# Patient Record
Sex: Male | Born: 1990 | Race: White | Hispanic: No | Marital: Single | Smoking: Never smoker
Health system: Southern US, Community
[De-identification: ages and names within clinical notes are randomized; demographics above are authoritative.]

## PROBLEM LIST (undated history)

## (undated) DIAGNOSIS — F419 Anxiety disorder, unspecified: Secondary | ICD-10-CM

## (undated) DIAGNOSIS — E079 Disorder of thyroid, unspecified: Secondary | ICD-10-CM

## (undated) DIAGNOSIS — G629 Polyneuropathy, unspecified: Secondary | ICD-10-CM

## (undated) HISTORY — PX: TONSILLECTOMY AND ADENOIDECTOMY: SUR1326

## (undated) HISTORY — DX: Anxiety disorder, unspecified: F41.9

## (undated) HISTORY — DX: Polyneuropathy, unspecified: G62.9

---

## 2011-11-03 HISTORY — PX: COSMETIC SURGERY: SHX468

## 2012-03-16 ENCOUNTER — Ambulatory Visit (INDEPENDENT_AMBULATORY_CARE_PROVIDER_SITE_OTHER): Payer: 59 | Admitting: Family Medicine

## 2012-03-16 ENCOUNTER — Telehealth: Payer: Self-pay | Admitting: Family Medicine

## 2012-03-16 ENCOUNTER — Encounter: Payer: Self-pay | Admitting: Family Medicine

## 2012-03-16 VITALS — BP 118/68 | HR 117 | Temp 99.0°F | Ht 64.5 in | Wt 175.0 lb

## 2012-03-16 DIAGNOSIS — L819 Disorder of pigmentation, unspecified: Secondary | ICD-10-CM

## 2012-03-16 NOTE — Patient Instructions (Addendum)
-  PLEASE SIGN UP FOR MYCHART TODAY   -We placed a referral for you to dermatology as discussed. It usually takes about 1-2 weeks to process and schedule this referral. If you have not heard from [place] regarding this appointment in 2 weeks please contact our office.  We recommend the following healthy lifestyle measures: - eat a healthy diet consisting of lots of vegetables, fruits, beans, nuts, seeds, healthy meats such as white chicken and fish and whole grains.  - avoid fried foods, fast food, processed foods, sodas, red meet and other fattening foods.  - get a least 150 minutes of aerobic exercise per week.   Follow up in: for physical once yearly

## 2012-03-16 NOTE — Progress Notes (Signed)
Chief Complaint  Patient presents with  . Establish Care    HPI: Brian Fowler is here to establish care. Working at [employer] and going to school. Used to way 300lbs and then exercised extensively and loss a ton of weight. Had breast reduction after that. Only sexually active with one partner and does not want STI testing. Refused diabetes and lipid screening.  Has the following concerns today:  Has hypopigmented areas of skin on hands, groin and genitalia only -wants to see dermatologist -never used steroid creams or hx of skin conditions  Other Providers: none  Vaccines: had flu, had tdap 4 years ago  ROS: See pertinent positives and negatives per HPI.  History reviewed. No pertinent past medical history.  Family History  Problem Relation Age of Onset  . Arthritis      grandparent  . Breast cancer Mother   . Cancer Mother     breast cancer  . Prostate cancer      grandparent  . Hyperlipidemia      grandparent  . Hypertension      grandparent/parent  . Kidney disease      grandparent   . Hypertension Father   . Diabetes Paternal Grandfather     History   Social History  . Marital Status: Single    Spouse Name: N/A    Number of Children: N/A  . Years of Education: N/A   Social History Main Topics  . Smoking status: Never Smoker   . Smokeless tobacco: None  . Alcohol Use: Yes  . Drug Use: None  . Sexually Active: None   Other Topics Concern  . None   Social History Narrative  . None    No current outpatient prescriptions on file.  EXAM:  Filed Vitals:   03/16/12 1300  BP: 118/68  Pulse: 117  Temp: 99 F (37.2 C)    Body mass index is 29.57 kg/(m^2).  GENERAL: vitals reviewed and listed above, alert, oriented, appears well hydrated and in no acute distress  HEENT: atraumatic, conjunttiva clear, no obvious abnormalities on inspection of external nose and ears  NECK: no obvious masses on inspection  LUNGS: clear to auscultation  bilaterally, no wheezes, rales or rhonchi, good air movement  CV: HRRR, no peripheral edema  SKIN: hypogmented patches on hands  MS: moves all extremities without noticeable abnormality  PSYCH: pleasant and cooperative, no obvious depression or anxiety  ASSESSMENT AND PLAN:  Discussed the following assessment and plan:  1. Skin hypopigmentation  Ambulatory referral to Dermatology  -discussed potential etiologies for hypopigmented skin including vitiligo and other causes - pt would like to see derm for definitive dx, referral placed -congratulated on weight loss and advised continued healthy lifestyle -offered screening for STIs, lipid and diabetes - pt refused -counseled on safe drinking -We reviewed the PMH, PSH, FH, SH, Meds and Allergies. -We addressed current concerns per orders and patient instructions. -We have advised patient to follow up per instructions below.   -Patient advised to return or notify a doctor immediately if symptoms worsen or persist or new concerns arise.  Patient Instructions  -PLEASE SIGN UP FOR MYCHART TODAY   -We placed a referral for you to dermatology as discussed. It usually takes about 1-2 weeks to process and schedule this referral. If you have not heard from [place] regarding this appointment in 2 weeks please contact our office.  We recommend the following healthy lifestyle measures: - eat a healthy diet consisting of lots of vegetables, fruits, beans,  nuts, seeds, healthy meats such as white chicken and fish and whole grains.  - avoid fried foods, fast food, processed foods, sodas, red meet and other fattening foods.  - get a least 150 minutes of aerobic exercise per week.   Follow up in: for physical once yearly      KIM, Dahlia Client R.

## 2012-03-16 NOTE — Telephone Encounter (Signed)
Pts mom came by and said that her son came in for new pt appt today at 4:15pm. Pts mom said that this should have been for a cpx. Pts mom is req that her son get a lab order to have cpx labs and also req to get pts Vit D lvl checked at [facility]. Pls order the req labs and mail the script for labs to pts home address.   Also pts mom believes that its been a long time since pt had a tetanus shot. Pt works for [employer] and there is a high risk for injury.

## 2012-03-17 NOTE — Telephone Encounter (Signed)
Left a message for return call.  

## 2012-03-17 NOTE — Telephone Encounter (Signed)
Pls advise on labs.  

## 2012-03-17 NOTE — Telephone Encounter (Signed)
Lab testing for labs recommended for his age were offered at the visit - patient declined. Also, tdap was offered - pt reported he had tetanus booster 4 years ago.

## 2012-03-24 NOTE — Telephone Encounter (Signed)
Left a message for pt to return call 

## 2012-03-25 NOTE — Telephone Encounter (Signed)
Left a message for return call.  

## 2012-03-31 ENCOUNTER — Telehealth: Payer: Self-pay | Admitting: Family Medicine

## 2012-03-31 NOTE — Telephone Encounter (Signed)
Pts mom called and said that pt hasn't had a tetanus shot since he was a little boy. Pt also needs to get a lab order done at [facility] for whatever labs a person his age needs and also Vit D. Has to get labs done at [facility] for pts insurance to cover. Pts mom will pick pick up lab order when done.

## 2012-04-05 NOTE — Telephone Encounter (Signed)
Please see phone note from 03/2012. If pt needs tdap - can come for nurse appt for this. Vitamin D screening is not recommended for his age. Labs for his age (STI screening) were discussed at his appointment and he declined. Please contact patient and see if he has any specific concerns. He is old enough to make his own medical decisions.

## 2012-04-05 NOTE — Telephone Encounter (Signed)
Attempted to call pt on his cell phone and home number.  No answer and no return call. Pls advise.

## 2012-04-07 NOTE — Telephone Encounter (Signed)
Attempted to call pt home and cell phone- messages left for return call.

## 2012-04-12 NOTE — Telephone Encounter (Signed)
Letter mailed to pts home address.  

## 2012-06-02 ENCOUNTER — Telehealth: Payer: Self-pay | Admitting: Family Medicine

## 2012-06-02 NOTE — Telephone Encounter (Signed)
Probably should be seen and bring labs so can decide if referral needed and if so what referral is for. If wants endocrine referral I can place this, but don't know what it is for?

## 2012-06-02 NOTE — Telephone Encounter (Signed)
Pls advise.  

## 2012-06-02 NOTE — Telephone Encounter (Signed)
Pt followed up w/ dermatologist as you advised. Thyroid test showed abnormal. Is this something you can address or does pt need to go to endocrinologist?

## 2012-06-03 ENCOUNTER — Telehealth: Payer: Self-pay

## 2012-06-03 ENCOUNTER — Encounter: Payer: Self-pay | Admitting: Family Medicine

## 2012-06-03 ENCOUNTER — Ambulatory Visit (INDEPENDENT_AMBULATORY_CARE_PROVIDER_SITE_OTHER): Payer: 59 | Admitting: Family Medicine

## 2012-06-03 VITALS — BP 104/68 | HR 102 | Temp 98.2°F | Wt 170.0 lb

## 2012-06-03 DIAGNOSIS — F411 Generalized anxiety disorder: Secondary | ICD-10-CM

## 2012-06-03 DIAGNOSIS — R946 Abnormal results of thyroid function studies: Secondary | ICD-10-CM

## 2012-06-03 DIAGNOSIS — R7989 Other specified abnormal findings of blood chemistry: Secondary | ICD-10-CM

## 2012-06-03 DIAGNOSIS — F419 Anxiety disorder, unspecified: Secondary | ICD-10-CM

## 2012-06-03 NOTE — Telephone Encounter (Signed)
Brian Fowler,  Mom called today to ask about this. Per chart note, I made Brian Fowler and appointment for later today. Mom bringing him and the labs.

## 2012-06-03 NOTE — Progress Notes (Signed)
Chief Complaint  Patient presents with  . Discuss Thyroid Lab    HPI:  Acute visit for abnormal thyroid labs: -wanted referral to endocrine per phone notes - but did not know nature of problem to place referral -no-one in immediate family with thyroid issues -hx of intentional extreme weight loss with breast reduction surgery following weight loss -labs drawn at derm office and showed low TSH at 0.005 -pt reports he feels fine -denies: fevers, chills, heat/cold intolerance, hair loss, emotional lability, weakness, tremor, palpitations, increased perspiration, and weight loss despite a normal or increased appetite, constipation, myalgia, fatigue, HA, polyuria, poydipsia -does have anxiety and stress related to a recent break up from long term relationship - seeing a psychiatrist -has vitiligo recently dx by Dr. Terri Piedra   ROS: See pertinent positives and negatives per HPI.  No past medical history on file.  Family History  Problem Relation Age of Onset  . Arthritis      grandparent  . Breast cancer Mother   . Cancer Mother     breast cancer  . Prostate cancer      grandparent  . Hyperlipidemia      grandparent  . Hypertension      grandparent/parent  . Kidney disease      grandparent   . Hypertension Father   . Diabetes Paternal Grandfather     History   Social History  . Marital Status: Single    Spouse Name: N/A    Number of Children: N/A  . Years of Education: N/A   Social History Main Topics  . Smoking status: Never Smoker   . Smokeless tobacco: None  . Alcohol Use: Yes  . Drug Use: None  . Sexually Active: None   Other Topics Concern  . None   Social History Narrative  . None    Current outpatient prescriptions:escitalopram (LEXAPRO) 10 MG tablet, Take 10 mg by mouth daily., Disp: , Rfl:   EXAM:  Filed Vitals:   06/03/12 1356  BP: 104/68  Pulse: 102  Temp: 98.2 F (36.8 C)   Pulse normal at 82 and regular on my exam There is no height on  file to calculate BMI.  GENERAL: vitals reviewed and listed above, alert, oriented, appears well hydrated and in no acute distress  HEENT: atraumatic, conjunttiva clear, no obvious abnormalities on inspection of external nose and ears, no bulging eye  NECK: no obvious masses on inspection, thyroid normal on palpation  LUNGS: clear to auscultation bilaterally, no wheezes, rales or rhonchi, good air movement  CV: HRRR, no peripheral edema  MS/NEURO: moves all extremities without noticeable abnormality  PSYCH: pleasant and cooperative, no obvious depression or anxiety  ASSESSMENT AND PLAN:  Discussed the following assessment and plan:  1. Anxiety - followed by Dr. Antony Madura    2. Abnormal thyroid blood test  TSH, T4, Free, T3, Free   -will recheck TSH, Free T3 and T4 - mother wants referral to endo after results  -pt asymptomatic currently, recently started lexapro - no other meds and has situational cause for anxiety - calm on exam  -follow up pending results -Patient advised to return or notify a doctor immediately if symptoms worsen or persist or new concerns arise.  There are no Patient Instructions on file for this visit.   Kriste Basque R.

## 2012-06-03 NOTE — Telephone Encounter (Signed)
Pt's mother called and states that raspberry ketones ( he seen this on Dr. Neil Crouch).  Pt confessed to his mother that he had been taking this at least a year ago.  Pt's mother states she looked this up online and this and it states this could cause thyroid problems.  Pt's mother just wants Dr. Selena Batten aware.

## 2012-06-08 NOTE — Telephone Encounter (Signed)
Pt with repeat thyroid studies abnormal. Referral placed to endocrine for evaluation. Please let mother know. Thanks!

## 2012-06-08 NOTE — Telephone Encounter (Signed)
Spoke to pt's mother told her Thyroid labs came back today and confirm elevated thyroid and referral was sent to Endocrinology per dr. Selena Batten. Pt's mother verbalized understanding and stated they were already notified have an appointment on Thursday with Endo. Pt's mother concerned about cancer and wants to speaks to Dr. Selena Batten. Told her I will have her get back to her. Spoke to Dr. Selena Batten she said she can not answer that for sure but usually just hyperthyroidism which you see a lot of. Called pt's mother back and told her Spoke to Dr. Selena Batten she said she can not answer that for sure but usually just hyperthyroidism which you see a lot of and needs to see specialist. Pt's mother verbalized understanding.

## 2012-06-08 NOTE — Telephone Encounter (Signed)
Pt would like results of labs done.

## 2012-06-10 ENCOUNTER — Ambulatory Visit (INDEPENDENT_AMBULATORY_CARE_PROVIDER_SITE_OTHER): Payer: 59 | Admitting: Internal Medicine

## 2012-06-10 ENCOUNTER — Encounter: Payer: Self-pay | Admitting: Internal Medicine

## 2012-06-10 VITALS — BP 108/60 | HR 87 | Temp 98.4°F | Resp 16 | Ht 65.0 in | Wt 168.0 lb

## 2012-06-10 DIAGNOSIS — E0590 Thyrotoxicosis, unspecified without thyrotoxic crisis or storm: Secondary | ICD-10-CM

## 2012-06-10 DIAGNOSIS — E0500 Thyrotoxicosis with diffuse goiter without thyrotoxic crisis or storm: Secondary | ICD-10-CM

## 2012-06-10 NOTE — Progress Notes (Addendum)
Subjective:     Patient ID: Brian Fowler, male   DOB: 1991/03/26, 22 y.o.   MRN: [id number]  HPI Brian Fowler is a 22 -year-old man, referred by his primary care physician, Brian Fowler, for evaluation and treatment for hyperthyroidism.  I reviewed last PCP note from 06/03/12. Pt was seen by Brian Fowler to establish care after a TSH returned 0.005 in dermatology office. In 06/03/2012, a repeat TSH returned again low, at 0.005, a fT4 was high, at 3.73 (0.82-1.77), while a free T3 was 15.7 (2-4.4). Pt and his mother wanted a referral to Endocrine for this finding.  No immediate FH of thyroid issues. Cousin has hypothyroidism. Pt has been diagnosed with vitiligo last year - sees Brian Fowler. He checked pt's thyroid tests based on the presence of this autoimmune disorder.  Pt has a h/o intentional extreme weight loss of 100 lbs in a little over a year, due to dieting and exercise and had to have breast reduction surgery following weight loss. He does mention, though, that he appears to be able to eat what he wants without gaining the weight back. His mom tells me that he has anxiety and stress related to a recent break up from long term relationship (this ended about 8 months ago) - seeing a psychiatrist: Dr Brian Fowler - they would like that I sent my note to him, too. Has ADHD dx in 2nd grade, recently started on Medication: Lexapro - he does not feel that this is helping. He has not been on Adderall or other stimulants.   He denies: fevers, chills, no heat/cold intolerance, tremor, palpitations, and recent weight loss despite a normal or increased appetite, diarrhea. He denies having problems with concentration in school more than in the past.  Review of Systems Constitutional: no weight gain/+ loss, + fatigue, no subjective hyperthermia/hypothermia, + insomnia Eyes: no blurry vision, no xerophthalmia ENT: no sore throat, no nodules palpated in throat, no dysphagia/odynophagia, no hoarseness Cardiovascular: no  CP/SOB/palpitations/leg swelling Respiratory: no cough/SOB Gastrointestinal: N when eating fatty foods/V/D/C Musculoskeletal: no muscle/joint aches Skin: no rashes Neurological: no tremors/numbness/tingling/dizziness Psychiatric: no depression/+ anxiety  Past Medical History  Diagnosis Date  . Anxiety    Past Surgical History  Procedure Date  . Tonsillectomy and adenoidectomy   . Cosmetic surgery 11/2011    for gynecomastia   History   Social History  . Marital Status: Single    Spouse Name: N/A    Number of Children: 0  . Years of Education: N/A   Occupational History  . student   Social History Main Topics  . Smoking status: Never Smoker   . Smokeless tobacco: Not on file  . Alcohol Use: 0.5 oz/week    1 drink(s) per week  . Drug Use: No  . Sexually Active: Yes    Birth Control/ Protection: Condom   Social History Narrative   ATTENDS GGTCHOBBIES: CARD STRATAGIES GAME: LIFT WEIGHTS AND RUNNING   Current Outpatient Prescriptions on File Prior to Visit  Medication Sig Dispense Refill  . escitalopram (LEXAPRO) 10 MG tablet Take 10 mg by mouth daily.       Allergies  Allergen Reactions  . Penicillins     Swelling, hives, SOB   Family History  Problem Relation Age of Onset  . Arthritis      grandparent  . Breast cancer Mother   . Cancer Mother     breast cancer  . Prostate cancer      grandparent  . Hyperlipidemia  grandparent  . Hypertension      grandparent/parent  . Kidney disease      grandparent   . Hypertension Father   . Diabetes Paternal Grandfather    Objective:   Physical Exam BP 108/60  Pulse 87  Temp 98.4 F (36.9 C) (Oral)  Resp 16  Ht 5\' 5"  (1.651 m)  Wt 168 lb (76.204 kg)  BMI 27.96 kg/m2  SpO2 98%   Wt Readings from Last 3 Encounters:  06/10/12 168 lb (76.204 kg)  06/03/12 170 lb (77.111 kg)  03/16/12 175 lb (79.379 kg)   Constitutional: over weight, in NAD Eyes: PERRLA, EOMI, no exophthalmos ENT: moist mucous  membranes, no thyromegaly but thyroid bruit auscultated over R thyroid, no cervical lymphadenopathy Cardiovascular: RRR, No MRG Respiratory: CTA B Gastrointestinal: abdomen soft, NT, ND, BS+ Musculoskeletal: no deformities, strength intact in all 4 Skin: moist, warm, no rashes, stretch marks on arms, faint vitiligo patches on elbows Neurological: no tremor with outstretched hands, DTR normal in all 4  Assessment:     1. Hyperthyroidism    Plan:      Pt has a history of 100-pound intentional weight loss over a year, however this might have been contributed to by his hyperthyroidism. It is unknown for how long he has had the hyperthyroidism, and also he cannot remember if he had an upper respiratory infection episode or tenderness in the thyroid region in the previous months. - We discussed about the fact that his tests are undoubtedly abnormal suggesting overt hyperthyroidism. The fact that he does not have symptoms might suggest that his tests were abnormal for a longer time. His anxiety may be, in part, explained by the hyperthyroidism. - I explained the 3 main causes for hyperthyroidism: 1. Thyroiditis 2. Graves' disease (he already has an autoimmune disorder, so he is predisposed for the second) 3. uni-or multinodular goiter - The only way to differentiate between these 3 is to do a thyroid uptake and scan - I ordered the test, and the patient will be called with the appointment date and time - As I get the results back, I will call them with a further plan, which might involve observation (4 thyroiditis), radioactive iodine treatment or methimazole treatment.  - The fact that he has a thyroid bruit biases the diagnosis towards Graves ds. I explained that in case we decide to get the radioactive iodine ablation, we might be successful at the first try or we might need to repeat the treatment. I explained the natural evolution of Graves ds., and the fact that we can use methimazole for a  period of time while waiting for the inflammation to resolve by itself. - I will see the patient back in 2 months, regardless of what treatment we will initiate  New Dx of Graves Ds:  Clinical Data: Hyperthyroidism. Serum TSH: 0.005.   THYROID SCAN AND UPTAKE - 24 HOURS   Technique: Following the per oral administration of I-131 Sodium  Iodide, the patient returned at 24 hours and uptake measurements  were acquired with the uptake probe centered on the neck. Thyroid  imaging was performed following the intravenous administration of  the Tc-39m Pertechnetate.  Radiopharmaceuticals: 12.9uCi I-131 Sodium Iodide orally and78mCi  Tc-52m Pertechnetate intravenously.  Comparison: None.  Findings: The 24 hour uptake by the thyroid gland is 53.8%.  Normal 24 hour uptake range is 10-35%.  The thyroid scan demonstrates slightly heterogeneous activity  within the right lobe without focal hot or cold nodule. The  activity in the left lobe is fairly homogeneous.   IMPRESSION:  1. Elevated (54%) 24 hour radioiodine uptake by the thyroid gland.  2. 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 No discrete nodularity is apparent.  Original Report Authenticated By: Carey Bullocks, M.D.  Discussed with pt and his parents >> offered to either try MMI or the more definitive RAI ablation. They thought about it over the weekend >> decided for RAI ablation. I will arrange for this. Will see pt back in 3-4 weeks after his ablation.

## 2012-06-10 NOTE — Patient Instructions (Signed)
Please return in 2 months.  Hyperthyroidism The thyroid is a large gland located in the lower front part of your neck. The thyroid helps control metabolism. Metabolism is how your body uses food. It controls metabolism with the hormone thyroxine. When the thyroid is overactive, it produces too much hormone. When this happens, these following problems may occur:   Nervousness  Heat intolerance  Weight loss (in spite of increase food intake)  Diarrhea  Change in hair or skin texture  Palpitations (heart skipping or having extra beats)  Tachycardia (rapid heart rate)  Loss of menstruation (amenorrhea)  Shaking of the hands CAUSES  Grave's Disease (the immune system attacks the thyroid gland). This is the most common cause.  Inflammation of the thyroid gland.  Tumor (usually benign) in the thyroid gland or elsewhere.  Excessive use of thyroid medications (both prescription and 'natural').  Excessive ingestion of Iodine. DIAGNOSIS  To prove hyperthyroidism, your caregiver may do blood tests and ultrasound tests. Sometimes the signs are hidden. It may be necessary for your caregiver to watch this illness with blood tests, either before or after diagnosis and treatment. TREATMENT Short-term treatment There are several treatments to control symptoms. Drugs called beta blockers may give some relief. Drugs that decrease hormone production will provide temporary relief in many people. These measures will usually not give permanent relief. Definitive therapy There are treatments available which can be discussed between you and your caregiver which will permanently treat the problem. These treatments range from surgery (removal of the thyroid), to the use of radioactive iodine (destroys the thyroid by radiation), to the use of antithyroid drugs (interfere with hormone synthesis). The first two treatments are permanent and usually successful. They most often require hormone replacement  therapy for life. This is because it is impossible to remove or destroy the exact amount of thyroid required to make a person euthyroid (normal). HOME CARE INSTRUCTIONS  See your caregiver if the problems you are being treated for get worse. Examples of this would be the problems listed above. SEEK MEDICAL CARE IF: Your general condition worsens. MAKE SURE YOU:   Understand these instructions.  Will watch your condition.  Will get help right away if you are not doing well or get worse. Document Released: 04/21/2005 Document Revised: 07/14/2011 Document Reviewed: 09/02/2006 [facility] Patient Information 2013 [place], [place].

## 2012-06-15 ENCOUNTER — Telehealth: Payer: Self-pay | Admitting: *Deleted

## 2012-06-15 NOTE — Telephone Encounter (Signed)
Called and talked with insurance dr. The problem was that the dx was entered as hypothyroidism, but he has hyperthyroidism. Test was approved for 45 days.   Notification nr.: [phone]  Will route to [facility].

## 2012-06-15 NOTE — Telephone Encounter (Signed)
MARY , PPC AT [facility]. CALLED STATED THAT PATIENT INSURANCE [employer] IS REQUIRING A PEER TO PEER CALL FROM DR. GHERGHE  TO CONSIDER AUTHORIZING TEST THYROID UPTAKE AND SCAN FOR PATIENT. Fuller Song [employer] [phone] OPTION 3:  CASE # IS [phone] ::ID # IS [id number]. PLEASE CALL MARY AT PATIENT CARE COODINATOR AT [phone]. WITH AUTHORIZATION # IF APPROVED SO SHE CAN ENTER INTO CHART.

## 2012-06-24 ENCOUNTER — Encounter (HOSPITAL_COMMUNITY)
Admission: RE | Admit: 2012-06-24 | Discharge: 2012-06-24 | Disposition: A | Discharge status: Home / Self Care | Payer: 59 | Source: Ambulatory Visit | Attending: Internal Medicine | Admitting: Internal Medicine

## 2012-06-24 DIAGNOSIS — E0590 Thyrotoxicosis, unspecified without thyrotoxic crisis or storm: Secondary | ICD-10-CM | POA: Insufficient documentation

## 2012-06-25 ENCOUNTER — Encounter (HOSPITAL_COMMUNITY)
Admission: RE | Admit: 2012-06-25 | Discharge: 2012-06-25 | Disposition: A | Discharge status: Home / Self Care | Payer: 59 | Source: Ambulatory Visit | Attending: Internal Medicine | Admitting: Internal Medicine

## 2012-06-25 DIAGNOSIS — E0590 Thyrotoxicosis, unspecified without thyrotoxic crisis or storm: Secondary | ICD-10-CM | POA: Insufficient documentation

## 2012-06-25 MED ORDER — SODIUM IODIDE I 131 CAPSULE
12.9000 | Freq: Once | INTRAVENOUS | Status: AC | PRN
  Administered 2012-06-25: 12.9 via ORAL

## 2012-06-25 MED ORDER — SODIUM PERTECHNETATE TC 99M INJECTION
9.9000 | Freq: Once | INTRAVENOUS | Status: AC | PRN
  Administered 2012-06-25: 10 via INTRAVENOUS

## 2012-06-28 DIAGNOSIS — E0500 Thyrotoxicosis with diffuse goiter without thyrotoxic crisis or storm: Secondary | ICD-10-CM | POA: Insufficient documentation

## 2012-06-28 NOTE — Addendum Note (Signed)
Addended by: Carlus Pavlov on: 06/28/2012 08:25 AM   Modules accepted: Orders

## 2012-06-30 ENCOUNTER — Telehealth: Payer: Self-pay | Admitting: *Deleted

## 2012-06-30 NOTE — Telephone Encounter (Signed)
Spoke with father concerning of lab test prior to radio active iodine test. Father unaware that test had been rescheduled to March 6th at 1:00pm. I called  [facility] department to verify if any lab test are needed prior to test for [facility] on March 6th. Nurse and Doctor confirmed they needed no lab work done on patient. Called Mr. Vivona back and left message on his cell # of this information.

## 2012-06-30 NOTE — Telephone Encounter (Signed)
Patient mother called requesting that lab ordered be done prior to Radio Active Iodine testing to be done on March 6 th. Please notify mother or father of this.  Please call home # [phone] or dad at # [phone]. Let me know and I can call . Fannie Knee

## 2012-06-30 NOTE — Telephone Encounter (Signed)
All right, no labs then.

## 2012-06-30 NOTE — Telephone Encounter (Signed)
Fannie Knee, do you know if the [place] department required these labs? We can definitely repeat a TSH and free T4 if needed, but I was wondering what they had in mind when they called. I would appreciate if you can put in the order for these 2 labs as future orders for [facility] (dad works there and they are free for [facility]) and print them - pt or parents will need to come and pick them up.

## 2012-07-08 ENCOUNTER — Encounter (HOSPITAL_COMMUNITY)
Admission: RE | Admit: 2012-07-08 | Discharge: 2012-07-08 | Disposition: A | Discharge status: Home / Self Care | Payer: 59 | Source: Ambulatory Visit | Attending: Internal Medicine | Admitting: Internal Medicine

## 2012-07-08 ENCOUNTER — Encounter (HOSPITAL_COMMUNITY): Payer: Self-pay

## 2012-07-08 DIAGNOSIS — E0500 Thyrotoxicosis with diffuse goiter without thyrotoxic crisis or storm: Secondary | ICD-10-CM | POA: Insufficient documentation

## 2012-07-08 MED ORDER — SODIUM IODIDE I 131 CAPSULE: 16.1000 | Freq: Once | INTRAVENOUS | Status: DC | PRN

## 2012-07-08 MED ORDER — SODIUM IODIDE I 131 CAPSULE
16.1000 | Freq: Once | INTRAVENOUS | Status: AC | PRN
  Administered 2012-07-08: 16.1 via ORAL

## 2012-07-14 ENCOUNTER — Telehealth: Payer: Self-pay | Admitting: Internal Medicine

## 2012-07-14 NOTE — Telephone Encounter (Signed)
The patient's mother called to report that the patient is still experiencing weight loss and is having trouble sleeping since taking the "radiation pill".  Please call the patient's mother at [phone].

## 2012-07-15 NOTE — Telephone Encounter (Signed)
Patient's mother states that the patient has lost 9 lbs since taking the "radiation pill" and is having severe sleeping issues.  The patient's mother is very worried and anxious about her son's condition.  The patient's mother believes that he should have lab work prior to follow up appointment on 08/06/12.  She would need the rx mailed to her so that she can have her son's blood work done at [facility].  The patient's mother states that the patient's psychiatrist who is treating him ADD, has given him a new medication and the patient's mother feels it is making his sleeping issue worse. The patient would like to switch doctors to Dr. Everardo All and has scheduled his follow up from the i-131 on 08/06/12 to Dr. Everardo All.  The patient's mother requests return call asap to discuss her son's medical issues.

## 2012-07-15 NOTE — Telephone Encounter (Signed)
Brian Fowler, they require a call from me or from Dr. Everardo All (since they are switching their care to him)?

## 2012-07-16 ENCOUNTER — Telehealth: Payer: Self-pay | Admitting: Internal Medicine

## 2012-07-16 ENCOUNTER — Ambulatory Visit (HOSPITAL_COMMUNITY): Payer: 59

## 2012-07-16 DIAGNOSIS — E0500 Thyrotoxicosis with diffuse goiter without thyrotoxic crisis or storm: Secondary | ICD-10-CM

## 2012-07-16 DIAGNOSIS — G4700 Insomnia, unspecified: Secondary | ICD-10-CM

## 2012-07-16 MED ORDER — ALPRAZOLAM 0.5 MG PO TABS: ORAL_TABLET | ORAL | Status: DC

## 2012-07-16 NOTE — Telephone Encounter (Signed)
Called this am >> left VM that I will try again later.

## 2012-07-16 NOTE — Telephone Encounter (Signed)
Called by patient's mom as she did not know that I already talked to [place] earlier. She was complaining about Brian Fowler's not sleeping through the entire night she does not think that he can function like this. She also acknowledged that he has been having this problem for months, but she believes it is from his Graves' disease. I think this is possible. She also complains that he has lost 9 pounds in the last 2 weeks.   I offered to send Xanax to his pharmacy but will only supply one week after that they will need to contact either his PCP or his psychiatrist for further management. I explained that this will likely improve as the thyroid levels decrease.  They would want me to order labs for [name] next appointment, to be done at [name]. They want me to send the labs to their home (lab orders sent). I advised her to have [name] return to see [place] (per their choice, Dr. Everardo All) in 3 more weeks.

## 2012-07-16 NOTE — Telephone Encounter (Signed)
Dr. Elvera Lennox, The patient's mother requests return call from you since you are familiar with his condition and Dr. Everardo All has not assumed care of patient yet. Thanks, [employer]

## 2012-07-16 NOTE — Telephone Encounter (Signed)
Called and discussed with pt. He tells me that he has had issues with lack of sleep for a long time, and these are not exacerbated now. He functions well on "10 min sleep". He has tries many remedies for sleep, but nothing works. No other complaints. His appetite is back to normal, he says (it was increased before the RAI tx). I offered to call in something to let him sleep, but he told me that has already taken a lot of medicines for this and they don't work. I advised him to get in touch with Dr. Selena Batten and it would not be unreasonable to see a sleep specialist.

## 2012-08-05 ENCOUNTER — Ambulatory Visit: Payer: 59 | Admitting: Internal Medicine

## 2012-08-06 ENCOUNTER — Encounter: Payer: Self-pay | Admitting: Endocrinology

## 2012-08-06 ENCOUNTER — Ambulatory Visit (INDEPENDENT_AMBULATORY_CARE_PROVIDER_SITE_OTHER): Payer: 59 | Admitting: Endocrinology

## 2012-08-06 VITALS — BP 122/80 | HR 78 | Wt 170.0 lb

## 2012-08-06 DIAGNOSIS — E0500 Thyrotoxicosis with diffuse goiter without thyrotoxic crisis or storm: Secondary | ICD-10-CM

## 2012-08-06 MED ORDER — METHIMAZOLE 10 MG PO TABS: ORAL_TABLET | ORAL | Status: DC

## 2012-08-06 NOTE — Patient Instructions (Addendum)
i have sent a prescription to your pharmacy, to get the thyroid better faster. if ever you have fever while taking methimazole, stop it and call us, because of the risk of a rare side-effect.   The radioactive iodine will work over the next 1-2 months as well. Please come back for a follow-up appointment for 1 month.

## 2012-08-06 NOTE — Progress Notes (Signed)
  Subjective:    Patient ID: Brian Fowler, male    DOB: June 16, 1990, 22 y.o.   MRN: [id number]  HPI In march of 2014, pt had i-131 rx, for hyperthyroidism due to grave's dz.  He still has palpitations,nausea, and cough.  He says he has intermittent fever (103). Past Medical History  Diagnosis Date  . Anxiety     Past Surgical History  Procedure Laterality Date  . Tonsillectomy and adenoidectomy    . Cosmetic surgery  11/2011    for gynecomastia    History   Social History  . Marital Status: Single    Spouse Name: N/A    Number of Children: N/A  . Years of Education: N/A   Occupational History  . Not on file.   Social History Main Topics  . Smoking status: Never Smoker   . Smokeless tobacco: Not on file  . Alcohol Use: 0.5 oz/week    1 drink(s) per week  . Drug Use: No  . Sexually Active: Yes    Birth Control/ Protection: Condom   Other Topics Concern  . 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 ALPRAZolam (XANAX) 0.5 MG tablet Take half a tablet to 1 tablet at night by mouth as needed for sleep  10 tablet  0  . escitalopram (LEXAPRO) 10 MG tablet Take 10 mg by mouth daily.       No current facility-administered medications on file prior to visit.    Allergies  Allergen Reactions  . Penicillins     Swelling, hives, SOB    Family History  Problem Relation Age of Onset  . Arthritis      grandparent  . Breast cancer Mother   . Cancer Mother     breast cancer  . Prostate cancer      grandparent  . Hyperlipidemia      grandparent  . Hypertension      grandparent/parent  . Kidney disease      grandparent   . Hypertension Father   . Diabetes Paternal Grandfather    BP 122/80  Pulse 78  Wt 170 lb (77.111 kg)  BMI 28.29 kg/m2  SpO2 98%  Review of Systems Denies tremor, but he has  difficulty with concentration.      Objective:   Physical Exam VITAL SIGNS:  See vs page GENERAL: no distress NECK: There is slight, diffuse thyroid enlargement.  No thyroid nodule is palpable.    outside test results are reviewed: TSH=0.005 Free t4=3.43    Assessment & Plan:  Hyperthyroidism, severe.  Not yet better after i-131 rx.

## 2012-08-13 ENCOUNTER — Other Ambulatory Visit: Payer: Self-pay | Admitting: Endocrinology

## 2012-08-13 ENCOUNTER — Telehealth: Payer: Self-pay | Admitting: Endocrinology

## 2012-08-13 DIAGNOSIS — G4700 Insomnia, unspecified: Secondary | ICD-10-CM

## 2012-08-13 MED ORDER — TEMAZEPAM 30 MG PO CAPS: 30.0000 mg | ORAL_CAPSULE | Freq: Every evening | ORAL | Status: DC | PRN

## 2012-08-13 MED ORDER — ALPRAZOLAM 0.5 MG PO TABS: ORAL_TABLET | ORAL | Status: DC

## 2012-08-13 NOTE — Telephone Encounter (Signed)
Pt states the xanax does not work for him even when he takes 2 tablets at a time

## 2012-08-13 NOTE — Telephone Encounter (Signed)
Pt advised new sent to pharmacy

## 2012-08-13 NOTE — Telephone Encounter (Signed)
i have printed a new prescription.  Please exchange it for the alprazolam.

## 2012-08-13 NOTE — Telephone Encounter (Signed)
Pt unable to sleep, needs refill

## 2012-08-13 NOTE — Telephone Encounter (Signed)
i printed rx 

## 2012-08-16 ENCOUNTER — Telehealth: Payer: Self-pay | Admitting: *Deleted

## 2012-08-16 DIAGNOSIS — G4700 Insomnia, unspecified: Secondary | ICD-10-CM

## 2012-08-16 NOTE — Telephone Encounter (Signed)
Pt called stating that he is still having problems sleeping. He has not slept in 8 days straight. He is tired, but unable to sleep. Wants to know if there is a different medication he can get. Please advise.

## 2012-08-17 ENCOUNTER — Telehealth: Payer: Self-pay | Admitting: *Deleted

## 2012-08-17 MED ORDER — TRAZODONE HCL 50 MG PO TABS: 50.0000 mg | ORAL_TABLET | Freq: Every day | ORAL | Status: DC

## 2012-08-17 NOTE — Telephone Encounter (Signed)
It is not working. Please advise.

## 2012-08-17 NOTE — Telephone Encounter (Signed)
Carollee Herter, did Restoril not work, this was recently sent to pharm for him by Dr. Everardo All. If not, I will send something else.Marland Kitchen

## 2012-08-17 NOTE — Telephone Encounter (Signed)
Called pt and LVM letting him know that Dr Elvera Lennox has called in an rx for trazodone to help with sleeping to his pharmacy.

## 2012-08-17 NOTE — Telephone Encounter (Signed)
I sent trazodone to his pharmacy

## 2012-08-31 ENCOUNTER — Telehealth: Payer: Self-pay

## 2012-08-31 NOTE — Telephone Encounter (Signed)
Pt's father called stating he lost order to have labs done at [facility] please reorder, [phone]

## 2012-08-31 NOTE — Telephone Encounter (Signed)
i reprinted request

## 2012-09-01 ENCOUNTER — Encounter: Payer: Self-pay | Admitting: Endocrinology

## 2012-09-02 ENCOUNTER — Encounter: Payer: Self-pay | Admitting: Internal Medicine

## 2012-09-06 ENCOUNTER — Ambulatory Visit (INDEPENDENT_AMBULATORY_CARE_PROVIDER_SITE_OTHER): Payer: 59 | Admitting: Endocrinology

## 2012-09-06 ENCOUNTER — Encounter: Payer: Self-pay | Admitting: Endocrinology

## 2012-09-06 VITALS — BP 116/66 | HR 84 | Wt 168.0 lb

## 2012-09-06 DIAGNOSIS — E0500 Thyrotoxicosis with diffuse goiter without thyrotoxic crisis or storm: Secondary | ICD-10-CM

## 2012-09-06 NOTE — Patient Instructions (Addendum)
Please continue the same medications Please come back for a follow-up appointment in 1 month.   

## 2012-09-06 NOTE — Progress Notes (Signed)
  Subjective:    Patient ID: Brian Fowler, male    DOB: [birth year], 22 y.o.   MRN: [id number]  HPI in march of 2014, pt had i-131 rx, for hyperthyroidism due to grave's dz.  He says he still has severe insomnia and anxiety.  He started tapazole in early April.  He says he misses approx half the doses. Past Medical History  Diagnosis Date  . Anxiety     Past Surgical History  Procedure Laterality Date  . Tonsillectomy and adenoidectomy    . Cosmetic surgery  11/2011    for gynecomastia    History   Social History  . Marital Status: Single    Spouse Name: N/A    Number of Children: N/A  . Years of Education: N/A   Occupational History  . Not on file.   Social History Main Topics  . Smoking status: Never Smoker   . Smokeless tobacco: Not on file  . Alcohol Use: 0.5 oz/week    1 drink(s) per week  . Drug Use: No  . Sexually Active: Yes    Birth Control/ Protection: Condom   Other Topics Concern  . 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 methimazole (TAPAZOLE) 10 MG tablet 4 pills, twice a day  240 tablet  2   No current facility-administered medications on file prior to visit.    Allergies  Allergen Reactions  . Penicillins     Swelling, hives, SOB    Family History  Problem Relation Age of Onset  . Arthritis      grandparent  . Breast cancer Mother   . Cancer Mother     breast cancer  . Prostate cancer      grandparent  . Hyperlipidemia      grandparent  . Hypertension      grandparent/parent  . Kidney disease      grandparent   . Hypertension Father   . Diabetes Paternal Grandfather     BP 116/66  Pulse 84  Wt 168 lb (76.204 kg)  BMI 27.96 kg/m2  SpO2 97%  Review of Systems Fever is resolved.     Objective:   Physical Exam VITAL SIGNS:  See vs page GENERAL: no  distress NECK: There is slight, diffuse thyroid enlargement.  No thyroid nodule is palpable.  Skin: not diaphoretic Neuro: slight tremor of the hands   (i reviewed lab results)    Assessment & Plan:  Hyperthyroidism: not better yet.  therapy limited by noncompliance.  i'll do the best i can.

## 2012-09-28 ENCOUNTER — Encounter: Payer: Self-pay | Admitting: Endocrinology

## 2012-09-28 ENCOUNTER — Telehealth: Payer: Self-pay

## 2012-09-28 NOTE — Telephone Encounter (Signed)
LVOM  Pt to /u order for labs

## 2012-09-28 NOTE — Telephone Encounter (Signed)
Pt's called needs to pick an order to get labs done at [facility] for thyroid labs, please advise [phone]

## 2012-09-28 NOTE — Telephone Encounter (Signed)
i printed 

## 2012-10-08 ENCOUNTER — Ambulatory Visit (INDEPENDENT_AMBULATORY_CARE_PROVIDER_SITE_OTHER): Payer: 59 | Admitting: Endocrinology

## 2012-10-08 ENCOUNTER — Encounter: Payer: Self-pay | Admitting: Endocrinology

## 2012-10-08 VITALS — BP 124/74 | HR 76 | Ht 65.0 in | Wt 181.0 lb

## 2012-10-08 DIAGNOSIS — E890 Postprocedural hypothyroidism: Secondary | ICD-10-CM

## 2012-10-08 MED ORDER — LEVOTHYROXINE SODIUM 100 MCG PO TABS: 100.0000 ug | ORAL_TABLET | Freq: Every day | ORAL | Status: DC

## 2012-10-08 NOTE — Progress Notes (Signed)
  Subjective:    Patient ID: Brian Fowler, male    DOB: 05-28-1990, 22 y.o.   MRN: [id number]  HPI in march of 2014, pt had i-131 rx, for hyperthyroidism due to grave's dz.  He was rx'ed tapazole, in order to get prompt improvement in TFT.  He started tapazole in early April.  Since last ov, he has gained weight throughout the body.  Associated anxiety has improved, but it persists. Past Medical History  Diagnosis Date  . Anxiety     Past Surgical History  Procedure Laterality Date  . Tonsillectomy and adenoidectomy    . Cosmetic surgery  11/2011    for gynecomastia    History   Social History  . Marital Status: Single    Spouse Name: N/A    Number of Children: N/A  . Years of Education: N/A   Occupational History  . Not on file.   Social History Main Topics  . Smoking status: Never Smoker   . Smokeless tobacco: Not on file  . Alcohol Use: 0.5 oz/week    1 drink(s) per week  . Drug Use: No  . Sexually Active: Yes    Birth Control/ Protection: Condom   Other Topics Concern  . 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   No current facility-administered medications on file prior to visit.    Allergies  Allergen Reactions  . Penicillins     Swelling, hives, SOB    Family History  Problem Relation Age of Onset  . Arthritis      grandparent  . Breast cancer Mother   . Cancer Mother     breast cancer  . Prostate cancer      grandparent  . Hyperlipidemia      grandparent  . Hypertension      grandparent/parent  . Kidney disease      grandparent   . Hypertension Father   . Diabetes Paternal Grandfather     BP 124/74  Pulse 76  Ht 5\' 5"  (1.651 m)  Wt 181 lb (82.101 kg)  BMI 30.12 kg/m2  SpO2 98%  Review of Systems Denies fever.  Insomnia persists    Objective:   Physical Exam VITAL SIGNS:  See vs  page GENERAL: no distress NECK: There is no palpable thyroid enlargement.  No thyroid nodule is palpable.  No palpable lymphadenopathy at the anterior neck.     outside test results are reviewed: TSH and free T4 are both low    Assessment & Plan:  Hyperthyroidism.  It is unclear if the improvement is due to tapazole or i-131 rx.  i favor the latter. Anxiety.  This is not completely explained by the hyperthyroidism Weight gain.  His baseline weight was over 300 lbs, so he will need to resume exercise as soon as he feels he is able.

## 2012-10-08 NOTE — Patient Instructions (Signed)
Please stop taking the methimazole. i have sent a prescription to your pharmacy, to start the levothyroxine. Please redo the blood tests in 2 weeks.   Please come back for a follow-up appointment in 6 weeks.

## 2012-10-09 DIAGNOSIS — E039 Hypothyroidism, unspecified: Secondary | ICD-10-CM | POA: Insufficient documentation

## 2012-10-22 ENCOUNTER — Telehealth: Payer: Self-pay

## 2012-10-22 NOTE — Telephone Encounter (Signed)
Message copied by Sharlyne Pacas on Fri Oct 22, 2012  3:40 PM ------      Message from: Romero Belling      Created: Fri Oct 22, 2012  3:17 PM      Contact: [phone]       Please have pt do the labs from the last paper i gave him                  ----- Message -----         From: Sharlyne Pacas, CMA         Sent: 10/22/2012   2:50 PM           To: Romero Belling, MD                        ----- Message -----         From: Mathis Bud         Sent: 10/22/2012   2:36 PM           To: Sharlyne Pacas, CMA            Having side affects from Thyroid medication 100mg  Hypothyroxine insomnia and has gained 25 pds in 2 weeks       ------

## 2012-10-22 NOTE — Telephone Encounter (Signed)
Pt advised and states an understanding 

## 2012-10-28 ENCOUNTER — Telehealth: Payer: Self-pay | Admitting: *Deleted

## 2012-10-28 NOTE — Telephone Encounter (Signed)
Pt's mother called requesting results from lab work done this week. She is concerned that the pt is gaining a lot of weight. [phone]). Please advise.

## 2012-10-29 ENCOUNTER — Telehealth: Payer: Self-pay | Admitting: Endocrinology

## 2012-10-29 MED ORDER — LEVOTHYROXINE SODIUM 150 MCG PO TABS: 150.0000 ug | ORAL_TABLET | Freq: Every day | ORAL | Status: DC

## 2012-10-29 NOTE — Telephone Encounter (Signed)
Called pt and spoke with pt's mother. Advised her that Dr Everardo All reviewed his labs and he is increasing his levothyroxine to 150 mcg/day. Return as scheduled in 6 weeks. She understood and was grateful that we followed up.

## 2012-10-29 NOTE — Telephone Encounter (Signed)
Pt's mother called and lvm stating that Brian Fowler had labs done and she wanted to know the results and if his medication needs to change. [employer] and they are faxing results. Will call pt's mother with results when Dr Everardo All reviews them.

## 2012-10-29 NOTE — Telephone Encounter (Signed)
i need results 

## 2012-10-29 NOTE — Telephone Encounter (Signed)
please call patient: i reviewed labs. Please increase the levothyroxine to 150 mcg/day. Ret as scheduled

## 2012-10-29 NOTE — Telephone Encounter (Signed)
Returned call to pt's mother and lvm to return call.

## 2012-11-09 ENCOUNTER — Encounter: Payer: Self-pay | Admitting: Endocrinology

## 2012-11-12 ENCOUNTER — Telehealth: Payer: Self-pay | Admitting: Endocrinology

## 2012-11-12 NOTE — Telephone Encounter (Signed)
Patient needs an order written to get labs somewhere else. Pt also states that he is eating 1200 or less calories a day and is gaining weight. He is concerned and wants to know what Dr Everardo All can do to help with this. Please advise.

## 2012-11-15 ENCOUNTER — Other Ambulatory Visit: Payer: Self-pay | Admitting: Endocrinology

## 2012-11-15 ENCOUNTER — Telehealth: Payer: Self-pay | Admitting: Endocrinology

## 2012-11-15 NOTE — Telephone Encounter (Signed)
i printed Ov is due 

## 2012-11-15 NOTE — Telephone Encounter (Signed)
Pt was advised order for labs ready to be picked up, please scheudle pt a f/u appt

## 2012-11-19 ENCOUNTER — Encounter: Payer: Self-pay | Admitting: Endocrinology

## 2012-11-19 ENCOUNTER — Ambulatory Visit (INDEPENDENT_AMBULATORY_CARE_PROVIDER_SITE_OTHER): Payer: 59 | Admitting: Endocrinology

## 2012-11-19 VITALS — BP 100/54 | HR 88 | Temp 98.3°F | Ht 65.0 in | Wt 194.0 lb

## 2012-11-19 DIAGNOSIS — E890 Postprocedural hypothyroidism: Secondary | ICD-10-CM

## 2012-11-19 MED ORDER — LEVOTHYROXINE SODIUM 150 MCG PO TABS: 150.0000 ug | ORAL_TABLET | Freq: Every day | ORAL | Status: DC

## 2012-11-19 NOTE — Progress Notes (Signed)
  Subjective:    Patient ID: Brian Fowler, male    DOB: 1990-06-23, 23 y.o.   MRN: [id number]  HPI in march of 2014, pt had i-131 rx, for hyperthyroidism due to grave's dz.  He was then rx'ed tapazole, in order to get prompt improvement in TFT.  he continues to gain weight throughout the body.  Associated insomnia persists.   Past Medical History  Diagnosis Date  . Anxiety     Past Surgical History  Procedure Laterality Date  . Tonsillectomy and adenoidectomy    . Cosmetic surgery  11/2011    for gynecomastia    History   Social History  . Marital Status: Single    Spouse Name: N/A    Number of Children: N/A  . Years of Education: N/A   Occupational History  . Not on file.   Social History Main Topics  . Smoking status: Never Smoker   . Smokeless tobacco: Not on file  . Alcohol Use: 0.5 oz/week    1 drink(s) per week  . Drug Use: No  . Sexually Active: Yes    Birth Control/ Protection: Condom   Other Topics Concern  . 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   No current facility-administered medications on file prior to visit.    Allergies  Allergen Reactions  . Penicillins     Swelling, hives, SOB    Family History  Problem Relation Age of Onset  . Arthritis      grandparent  . Breast cancer Mother   . Cancer Mother     breast cancer  . Prostate cancer      grandparent  . Hyperlipidemia      grandparent  . Hypertension      grandparent/parent  . Kidney disease      grandparent   . Hypertension Father   . Diabetes Paternal Grandfather     BP 100/54  Pulse 88  Temp(Src) 98.3 F (36.8 C) (Oral)  Ht 5\' 5"  (1.651 m)  Wt 194 lb (87.998 kg)  BMI 32.28 kg/m2  SpO2 97%  Review of Systems He denies tremor and excessive diaphoresis.    Objective:   Physical Exam VITAL SIGNS:  See vs page GENERAL: no  distress NECK: There is no palpable thyroid enlargement.  No thyroid nodule is palpable.  No palpable lymphadenopathy at the anterior neck. Skin: not diaphoretic.   Neuro: no tremor.   (i reviewed lab results)    Assessment & Plan:  Post-i-131 hypothyroidism, overreplaced.  However, the i-131 my still be working.  This may prove to be is replacement dosage.

## 2012-11-19 NOTE — Patient Instructions (Addendum)
Please continue the same thyroid medication. Please come back for a follow-up appointment in 1 month, with blood tests a few days prior.

## 2012-11-29 LAB — TSH: TSH: 0.401 u[IU]/mL — ABNORMAL LOW (ref 0.450–4.500)

## 2012-12-06 ENCOUNTER — Other Ambulatory Visit: Payer: Self-pay

## 2012-12-06 MED ORDER — LEVOTHYROXINE SODIUM 150 MCG PO TABS: 150.0000 ug | ORAL_TABLET | Freq: Every day | ORAL | Status: DC

## 2012-12-30 ENCOUNTER — Telehealth: Payer: Self-pay | Admitting: Endocrinology

## 2012-12-30 NOTE — Telephone Encounter (Signed)
Called pt and advised him that his thyroid blood tests are very close to normal. Please continue with same medication. Pt states that he has been unable to sleep and had to quit school because of it. Pt is concerned and feels something is wrong. Pt wants to know if Dr Everardo All can see him before his next f/up appt. Please advise.

## 2012-12-30 NOTE — Telephone Encounter (Signed)
please call patient: thyroid blood tests are very close to normal. Please continue the same medication Please come back for a follow-up appointment in 1 month.

## 2012-12-30 NOTE — Telephone Encounter (Signed)
Yes, any open appt

## 2013-01-04 ENCOUNTER — Ambulatory Visit (INDEPENDENT_AMBULATORY_CARE_PROVIDER_SITE_OTHER): Payer: 59 | Admitting: Endocrinology

## 2013-01-04 ENCOUNTER — Encounter: Payer: Self-pay | Admitting: Endocrinology

## 2013-01-04 VITALS — BP 124/76 | HR 75 | Ht 65.0 in | Wt 192.0 lb

## 2013-01-04 DIAGNOSIS — E890 Postprocedural hypothyroidism: Secondary | ICD-10-CM

## 2013-01-04 MED ORDER — LEVOTHYROXINE SODIUM 150 MCG PO TABS: 150.0000 ug | ORAL_TABLET | Freq: Every day | ORAL | Status: DC

## 2013-01-04 NOTE — Patient Instructions (Addendum)
Please continue the same thyroid medication. Please come back for a follow-up appointment in 2 months. Here is a letter to get the blood tests done in 1 month.   Cc Dr Nolen Mu.

## 2013-01-04 NOTE — Progress Notes (Signed)
  Subjective:    Patient ID: Brian Fowler, male    DOB: 05-18-1990, 22 y.o.   MRN: [id number]  HPI in march of 2014, pt had i-131 rx, for hyperthyroidism due to grave's dz.  He was then rx'ed tapazole, in order to get prompt improvement in TFT.  When he developed hypothyroidism, he was rx'ed synthroid instead.  Severe insomnia persists.   Past Medical History  Diagnosis Date  . Anxiety     Past Surgical History  Procedure Laterality Date  . Tonsillectomy and adenoidectomy    . Cosmetic surgery  11/2011    for gynecomastia    History   Social History  . Marital Status: Single    Spouse Name: N/A    Number of Children: N/A  . Years of Education: N/A   Occupational History  . Not on file.   Social History Main Topics  . Smoking status: Never Smoker   . Smokeless tobacco: Not on file  . Alcohol Use: 0.5 oz/week    1 drink(s) per week  . Drug Use: No  . Sexual Activity: Yes    Birth Control/ Protection: Condom   Other Topics Concern  . 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   No current facility-administered medications on file prior to visit.    Allergies  Allergen Reactions  . Penicillins     Swelling, hives, SOB    Family History  Problem Relation Age of Onset  . Arthritis      grandparent  . Breast cancer Mother   . Cancer Mother     breast cancer  . Prostate cancer      grandparent  . Hyperlipidemia      grandparent  . Hypertension      grandparent/parent  . Kidney disease      grandparent   . Hypertension Father   . Diabetes Paternal Grandfather    BP 124/76  Pulse 75  Ht 5\' 5"  (1.651 m)  Wt 192 lb (87.091 kg)  BMI 31.95 kg/m2  SpO2 96%  Review of Systems Weight has plateaued    Objective:   Physical Exam VITAL SIGNS:  See vs page GENERAL: no distress NECK: There is no palpable thyroid  enlargement.  No thyroid nodule is palpable.  No palpable lymphadenopathy at the anterior neck. 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

## 2013-01-20 ENCOUNTER — Emergency Department (HOSPITAL_COMMUNITY): Payer: 59

## 2013-01-20 ENCOUNTER — Emergency Department (HOSPITAL_COMMUNITY)
Admission: EM | Admit: 2013-01-20 | Discharge: 2013-01-20 | Disposition: A | Discharge status: Home / Self Care | Payer: 59 | Attending: Emergency Medicine | Admitting: Emergency Medicine

## 2013-01-20 ENCOUNTER — Encounter (HOSPITAL_COMMUNITY): Payer: Self-pay | Admitting: *Deleted

## 2013-01-20 DIAGNOSIS — R5381 Other malaise: Secondary | ICD-10-CM | POA: Insufficient documentation

## 2013-01-20 DIAGNOSIS — H539 Unspecified visual disturbance: Secondary | ICD-10-CM

## 2013-01-20 DIAGNOSIS — F411 Generalized anxiety disorder: Secondary | ICD-10-CM | POA: Insufficient documentation

## 2013-01-20 DIAGNOSIS — H538 Other visual disturbances: Secondary | ICD-10-CM | POA: Insufficient documentation

## 2013-01-20 DIAGNOSIS — Z79899 Other long term (current) drug therapy: Secondary | ICD-10-CM | POA: Insufficient documentation

## 2013-01-20 DIAGNOSIS — R531 Weakness: Secondary | ICD-10-CM

## 2013-01-20 DIAGNOSIS — E039 Hypothyroidism, unspecified: Secondary | ICD-10-CM | POA: Insufficient documentation

## 2013-01-20 HISTORY — DX: Disorder of thyroid, unspecified: E07.9

## 2013-01-20 LAB — BASIC METABOLIC PANEL
BUN: 14 mg/dL (ref 6–23)
Calcium: 9.3 mg/dL (ref 8.4–10.5)
GFR calc Af Amer: >90 mL/min (ref >90)
GFR calc non Af Amer: >90 mL/min (ref >90)
Potassium: 4.3 mEq/L (ref 3.5–5.1)
Sodium: 139 mEq/L (ref 135–145)

## 2013-01-20 LAB — CBC WITH DIFFERENTIAL/PLATELET
Basophils Relative: 0 % (ref 0–1)
Eosinophils Absolute: 0.1 10*3/uL (ref 0.0–0.7)
Eosinophils Relative: 1 % (ref 0–5)
MCH: 29.5 pg (ref 26.0–34.0)
MCHC: 34.9 g/dL (ref 30.0–36.0)
MCV: 84.5 fL (ref 78.0–100.0)
Monocytes Relative: 5 % (ref 3–12)
Neutrophils Relative %: 76 % (ref 43–77)
Platelets: 306 10*3/uL (ref 150–400)

## 2013-01-20 IMAGING — CT CT HEAD W/O CM
2 series · 16 of 30 positions shown, 20 images · non-contrast
Comparison: None

CLINICAL DATA: Dizziness

EXAM:
CT HEAD WITHOUT CONTRAST
TECHNIQUE: Contiguous axial images were obtained from the base of the skull
through the vertex without intravenous contrast. Study was obtained
within 24 hr of patient's arrival at the emergency department.

[Series 2: head w/o · axial · non-contrast · 0.48mm/px · z∈[-133,-13]mm · 13 of 28 slices shown, 17 images]
[im 2/28  brain]
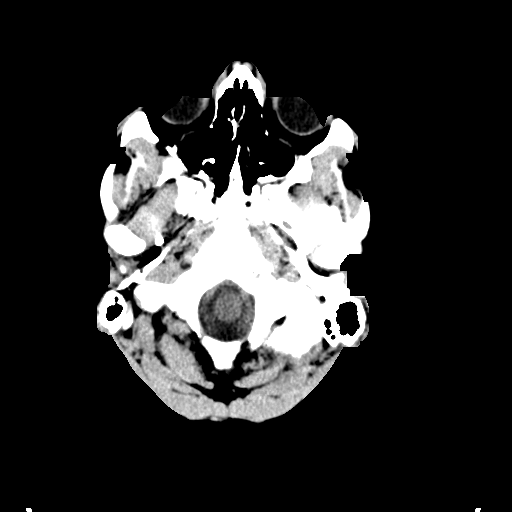
[im 2/28  bone]
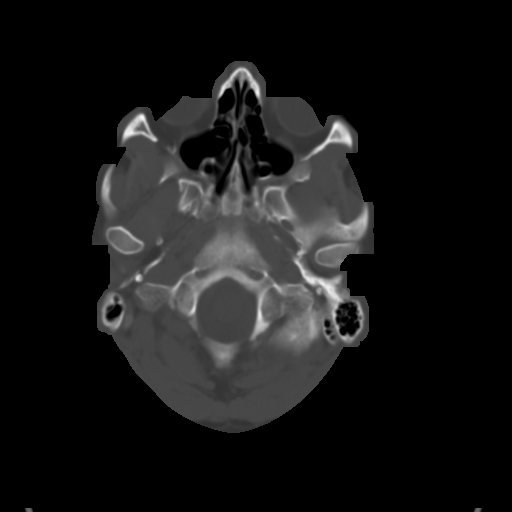
[im 4/28  brain]
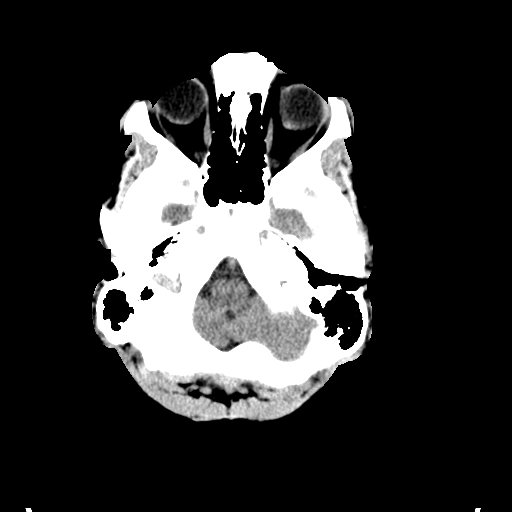
[im 6/28  brain]
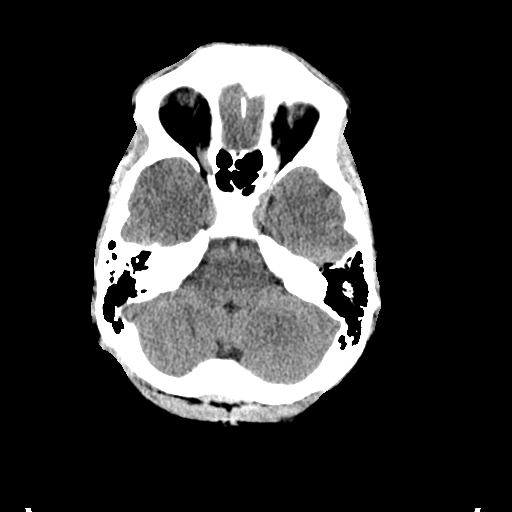
[im 8/28  brain]
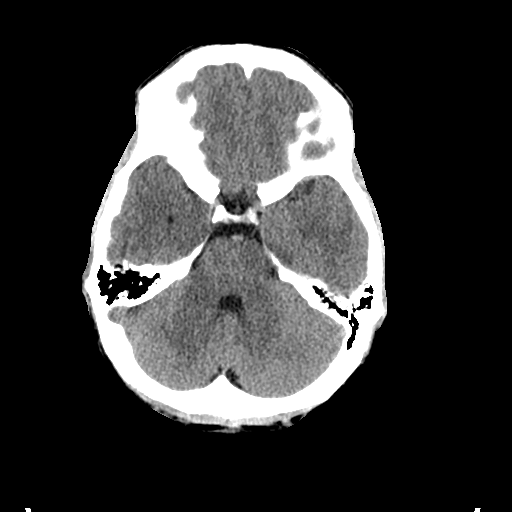
[im 10/28  brain]
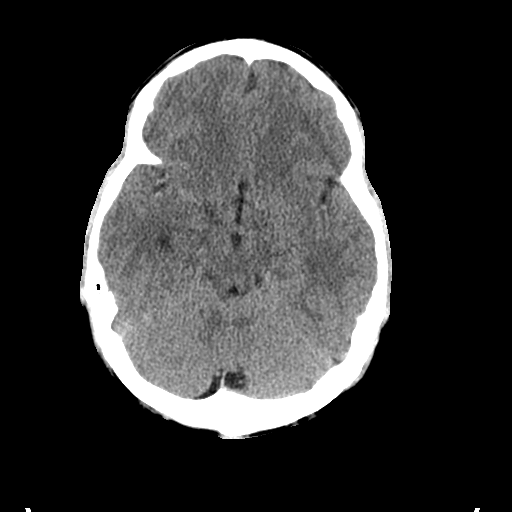
[im 10/28  bone]
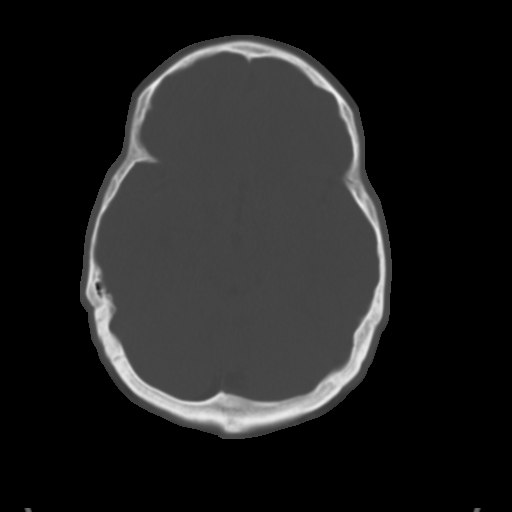
[im 12/28  brain]
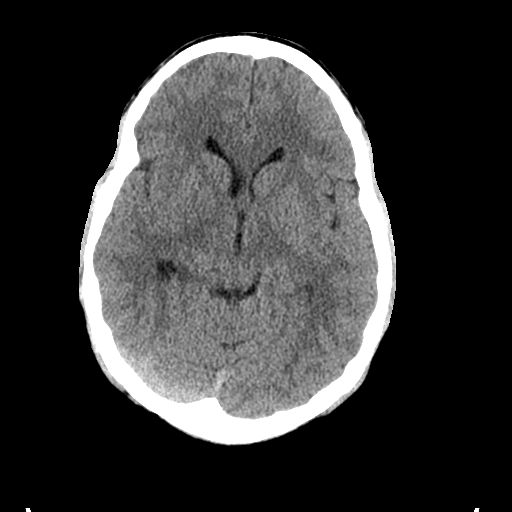
[im 14/28  brain]
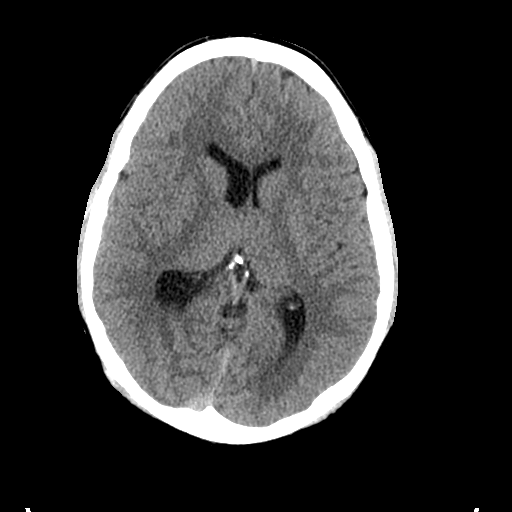
[im 16/28  brain]
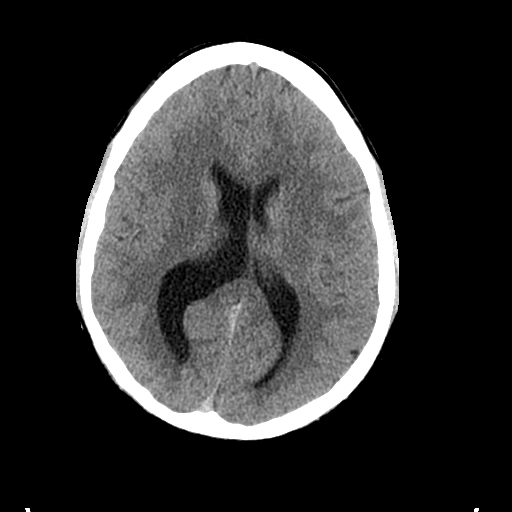
[im 18/28  brain]
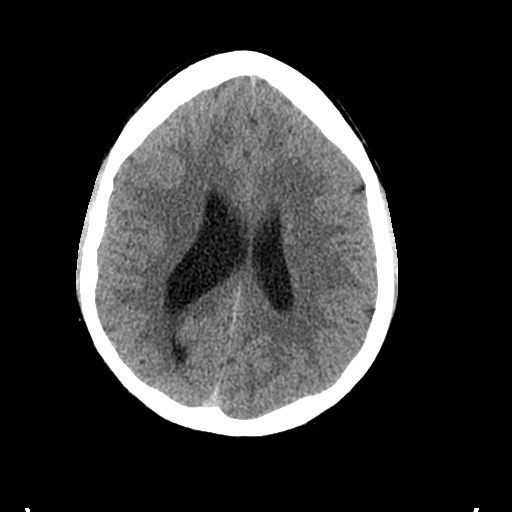
[im 18/28  bone]
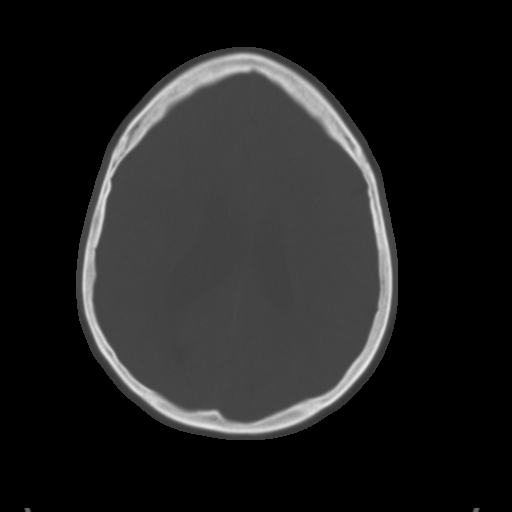
[im 20/28  brain]
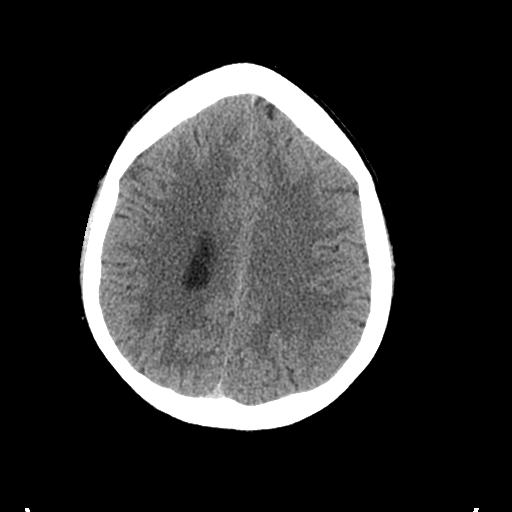
[im 22/28  brain]
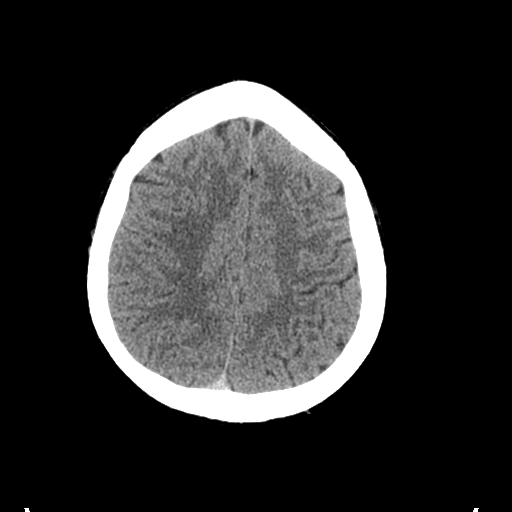
[im 24/28  brain]
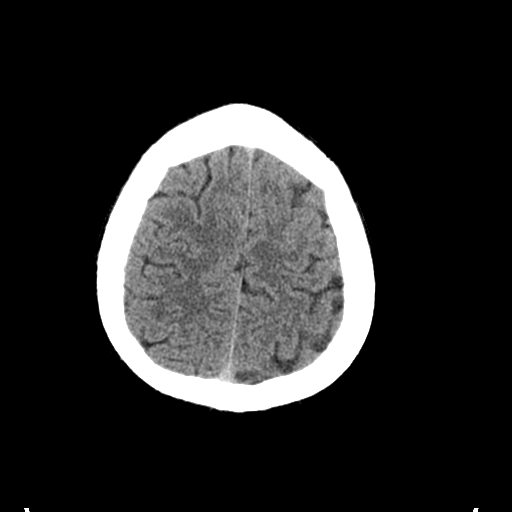
[im 26/28  brain]
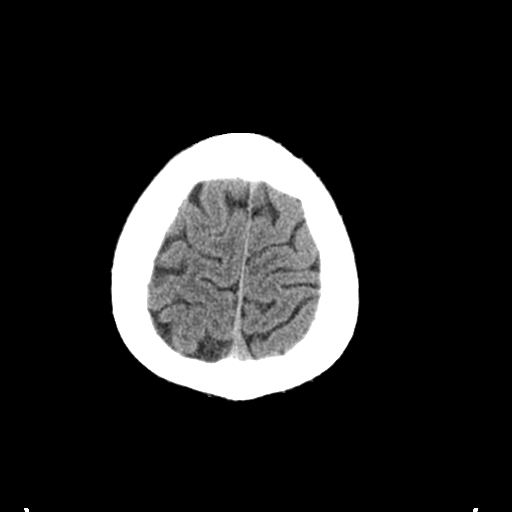
[im 26/28  bone]
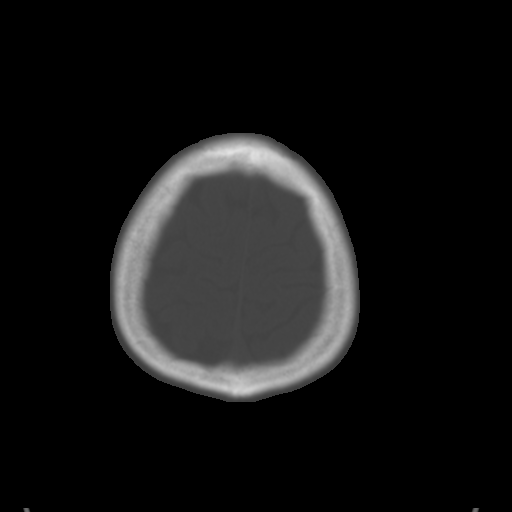

[Series 3: bone windows · axial · 0.48mm/px · z∈[-133,-93]mm · 3 of 28 slices shown]
[im 2/28  bone]
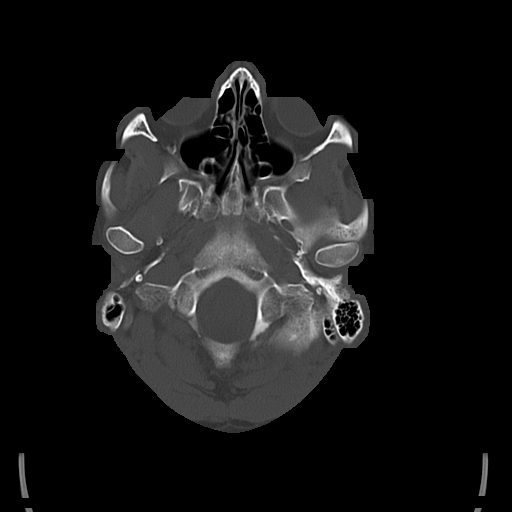
[im 6/28  bone]
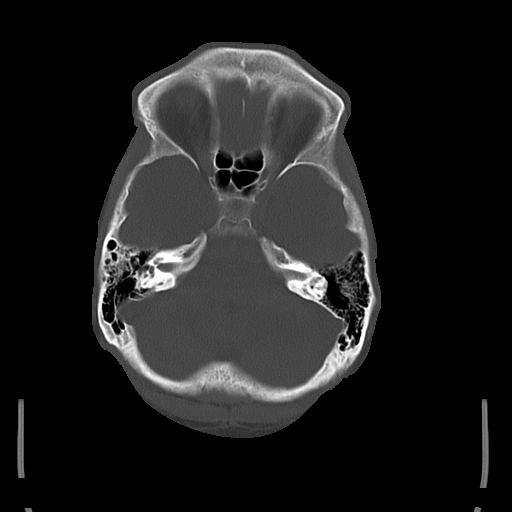
[im 10/28  bone]
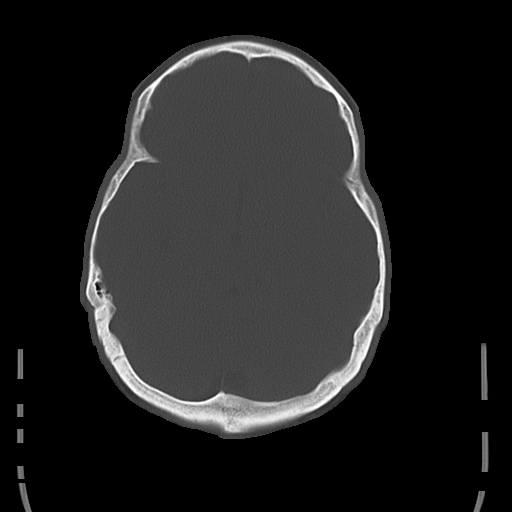

[16 of 30 positions shown; findings below may reference images not displayed]

FINDINGS: Ventricles overall are normal in size and configuration. The right
lateral ventricle is somewhat larger than the left lateral
ventricle, a presumed anatomic variant.

There is no mass, hemorrhage, extra-axial fluid collection, or
midline shift. Gray-white compartments are normal. There is no
demonstrable acute infarct.

Bony calvarium appears intact. The mastoid air cells are clear.
IMPRESSION: Study within normal limits.

## 2013-01-20 NOTE — ED Provider Notes (Signed)
CSN: [id number]     Arrival date & time 01/20/13  1356 History   First MD Initiated Contact with Patient 01/20/13 1404     Chief Complaint  Patient presents with  . Weakness   (Consider location/radiation/quality/duration/timing/severity/associated sxs/prior Treatment) HPI Comments: 22 yo male with hypothyroidism presents after episode of vision changes and leg weakness PTA.  Pt was driving and noticed vision was fuzzy and only cleared with covering one eye.  No hx of similar.  No illegal drugs.  Occasional alcohol.  Pt then felt his legs become weak.  Sxs resolved after 30 minutes.  No ha or neck pain.  No FH of muscle issues.  No seizure hx or shaking.  Improved with time.    Patient is a 22 y.o. male presenting with weakness. The history is provided by the patient.  Weakness This is a new problem. Pertinent negatives include no chest pain, no abdominal pain, no headaches and no shortness of breath.    Past Medical History  Diagnosis Date  . Anxiety   . Thyroid disease     hypothyroidism    Past Surgical History  Procedure Laterality Date  . Tonsillectomy and adenoidectomy    . Cosmetic surgery  11/2011    for gynecomastia   Family History  Problem Relation Age of Onset  . Arthritis      grandparent  . Breast cancer Mother   . Cancer Mother     breast cancer  . Prostate cancer      grandparent  . Hyperlipidemia      grandparent  . Hypertension      grandparent/parent  . Kidney disease      grandparent   . Hypertension Father   . Diabetes Paternal Grandfather    History  Substance Use Topics  . Smoking status: Never Smoker   . Smokeless tobacco: Not on file  . Alcohol Use: 0.5 oz/week    1 drink(s) per week    Review of Systems  Constitutional: Negative for fever and chills.  HENT: Negative for neck pain and neck stiffness.   Eyes: Positive for visual disturbance. Negative for photophobia.  Respiratory: Negative for shortness of breath.   Cardiovascular:  Negative for chest pain.  Gastrointestinal: Negative for vomiting and abdominal pain.  Genitourinary: Negative for dysuria and flank pain.  Musculoskeletal: Negative for back pain.  Skin: Negative for rash.  Neurological: Positive for weakness. Negative for dizziness, light-headedness and headaches.    Allergies  Penicillins  Home Medications   Current Outpatient Rx  Name  Route  Sig  Dispense  Refill  . levothyroxine (SYNTHROID, LEVOTHROID) 150 MCG tablet   Oral   Take 1 tablet (150 mcg total) by mouth daily before breakfast.   90 tablet   3   . zolpidem (AMBIEN) 5 MG tablet   Oral   Take 5 mg by mouth at bedtime.          BP 126/63  Pulse 82  Temp(Src) 98.2 F (36.8 C) (Oral)  Resp 20  Ht 5\' 5"  (1.651 m)  Wt 185 lb (83.915 kg)  BMI 30.79 kg/m2  SpO2 96% Physical Exam  Nursing note and vitals reviewed. Constitutional: He is oriented to person, place, and time. He appears well-developed and well-nourished.  HENT:  Head: Normocephalic and atraumatic.  Eyes: Conjunctivae are normal. Right eye exhibits no discharge. Left eye exhibits no discharge.  Neck: Normal range of motion. Neck supple. No tracheal deviation present.  Cardiovascular: Normal rate and regular  rhythm.   Pulmonary/Chest: Effort normal and breath sounds normal.  Abdominal: Soft. He exhibits no distension. There is no tenderness. There is no guarding.  Musculoskeletal: He exhibits no edema and no tenderness (no muscle tenderness).  Neurological: He is alert and oriented to person, place, and time. No cranial nerve deficit. GCS eye subscore is 4. GCS verbal subscore is 5. GCS motor subscore is 6.  Reflex Scores:      Patellar reflexes are 2+ on the right side and 2+ on the left side.      Achilles reflexes are 2+ on the right side and 2+ on the left side. 5+ strength in UE and LE with f/e at major joints. Sensation to palpation intact in UE and LE. CNs 2-12 grossly intact.  EOMFI.  PERRL.   Finger  nose and coordination intact bilateral.   Visual fields intact to finger testing.   Skin: Skin is warm. No rash noted.  Psychiatric: He has a normal mood and affect.    ED Course  Procedures (including critical care time) Labs Review Labs Reviewed  CBC WITH DIFFERENTIAL - Abnormal; Notable for the following:    WBC 11.4 (*)    Neutro Abs 8.7 (*)    All other components within normal limits  BASIC METABOLIC PANEL - Abnormal; Notable for the following:    Glucose, Bld 116 (*)    All other components within normal limits  TSH   Imaging Review No results found.  MDM  No diagnosis found. Plan to check electrolytes, specifically K and glucose.  CT head to look for signs of pituitary abnormality and possible cause of vision changes.  Pt has no sxs in ED, normal neuro. Plan for close outpt fup.    Well appearing in ED. No signs of GB.  Lytes okay.  CT head neg. Ct Head Wo Contrast  01/20/2013   CLINICAL DATA:  Dizziness  EXAM: CT HEAD WITHOUT CONTRAST  TECHNIQUE: Contiguous axial images were obtained from the base of the skull through the vertex without intravenous contrast. Study was obtained within 24 hr of patient's arrival at the emergency department.  COMPARISON:  None  FINDINGS: Ventricles overall are normal in size and configuration. The right lateral ventricle is somewhat larger than the left lateral ventricle, a presumed anatomic variant.  There is no mass, hemorrhage, extra-axial fluid collection, or midline shift. Gray-white compartments are normal. There is no demonstrable acute infarct.  Bony calvarium appears intact. The mastoid air cells are clear.  IMPRESSION: Study within normal limits.   Electronically Signed   By: Bretta Bang   On: 01/20/2013 15:03     Enid Skeens, MD 01/20/13 [phone]

## 2013-01-20 NOTE — ED Notes (Signed)
Patient states he was on way to the gym started to feel "weird"/ dizzy.  States his vision started to change while driving, It was had for him to focus his eyes.  Patient called EMS because he felt unsafe to drive and he was picked up at [employer]'s parking lot. Denies ever having the symptoms before.

## 2013-01-20 NOTE — ED Notes (Signed)
Pt escorted to discharge window. Verbalized understanding discharge instructions. In no acute distress.   

## 2013-05-17 ENCOUNTER — Ambulatory Visit (INDEPENDENT_AMBULATORY_CARE_PROVIDER_SITE_OTHER): Payer: 59 | Admitting: Endocrinology

## 2013-05-17 ENCOUNTER — Encounter: Payer: Self-pay | Admitting: Endocrinology

## 2013-05-17 VITALS — BP 108/60 | HR 74 | Temp 98.5°F | Ht 65.0 in | Wt 201.0 lb

## 2013-05-17 DIAGNOSIS — E890 Postprocedural hypothyroidism: Secondary | ICD-10-CM

## 2013-05-17 NOTE — Patient Instructions (Signed)
blood tests are being requested for you today.  We'll contact you with results. Please come back for a follow-up appointment in 6 months  

## 2013-05-17 NOTE — Progress Notes (Signed)
   Subjective:    Patient ID: Brian Fowler, male    DOB: 06-11-1990, 23 y.o.   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  He was then rx'ed tapazole, in order to get prompt improvement in TFT.  When he developed hypothyroidism, he was rx'ed synthroid instead.  He has recurrent weight gain and insomnia.   Past Medical History  Diagnosis Date  . Anxiety   . Thyroid disease     hypothyroidism     Past Surgical History  Procedure Laterality Date  . Tonsillectomy and adenoidectomy    . Cosmetic surgery  11/2011    for gynecomastia    History   Social History  . Marital Status: Single    Spouse Name: N/A    Number of Children: N/A  . Years of Education: N/A   Occupational History  . Not on file.   Social History Main Topics  . Smoking status: Never Smoker   . Smokeless tobacco: Not on file  . Alcohol Use: 0.5 oz/week    1 drink(s) per week  . Drug Use: No  . Sexual Activity: Yes    Birth Control/ Protection: Condom   Other Topics Concern  . 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 levothyroxine (SYNTHROID, LEVOTHROID) 150 MCG tablet Take 1 tablet (150 mcg total) by mouth daily before breakfast.  90 tablet  3  . zolpidem (AMBIEN) 5 MG tablet Take 5 mg by mouth at bedtime.       No current facility-administered medications on file prior to visit.    Allergies  Allergen Reactions  . Penicillins Anaphylaxis    Swelling, hives, SOB    Family History  Problem Relation Age of Onset  . Arthritis      grandparent  . Breast cancer Mother   . Cancer Mother     breast cancer  . Prostate cancer      grandparent  . Hyperlipidemia      grandparent  . Hypertension      grandparent/parent  . Kidney disease      grandparent   . Hypertension Father     . Diabetes Paternal Grandfather     BP 108/60  Pulse 74  Temp(Src) 98.5 F (36.9 C) (Oral)  Ht 5\' 5"  (1.651 m)  Wt 201 lb (91.173 kg)  BMI 33.45 kg/m2  SpO2 96%  Review of Systems Denies tremor.    Objective:   Physical Exam VITAL SIGNS:  See vs page GENERAL: no distress NECK: There is no palpable thyroid enlargement.  No thyroid nodule is palpable.  No palpable lymphadenopathy at the anterior neck. Skin: not diaphoretic Neuro: no tremor.        Assessment & Plan:  Post-i-131 hypothyroidism, on rx.

## 2013-05-26 LAB — TSH: TSH: 72.9 u[IU]/mL — AB (ref <=5.90)

## 2013-05-27 ENCOUNTER — Telehealth: Payer: Self-pay | Admitting: Endocrinology

## 2013-05-27 MED ORDER — LEVOTHYROXINE SODIUM 200 MCG PO TABS: 200.0000 ug | ORAL_TABLET | Freq: Every day | ORAL | Status: DC

## 2013-05-27 NOTE — Telephone Encounter (Signed)
please call patient: Thyroid level is low. Please increase the levothyroxine to 200 mcg/day.  i have sent a prescription to your pharmacy. Please repeat the blood test in 1 month.

## 2013-05-30 NOTE — Telephone Encounter (Signed)
Pt informed

## 2013-06-01 ENCOUNTER — Ambulatory Visit: Payer: 59 | Admitting: Endocrinology

## 2013-06-14 ENCOUNTER — Encounter: Payer: Self-pay | Admitting: Endocrinology

## 2013-06-23 ENCOUNTER — Telehealth: Payer: Self-pay | Admitting: Internal Medicine

## 2013-06-23 NOTE — Telephone Encounter (Signed)
Pt called stated his father, Elbert EwingsMichael Whitwell, spoke with Dr. Yetta BarreJones about Gerilyn PilgrimJacob becoming Dr. Yetta BarreJones new pt. Please advise

## 2013-06-26 NOTE — Telephone Encounter (Signed)
yes

## 2013-06-27 NOTE — Telephone Encounter (Signed)
appt scheduled

## 2013-07-06 ENCOUNTER — Ambulatory Visit (INDEPENDENT_AMBULATORY_CARE_PROVIDER_SITE_OTHER): Payer: 59 | Admitting: Internal Medicine

## 2013-07-06 ENCOUNTER — Other Ambulatory Visit (INDEPENDENT_AMBULATORY_CARE_PROVIDER_SITE_OTHER): Payer: 59

## 2013-07-06 ENCOUNTER — Encounter: Payer: Self-pay | Admitting: Internal Medicine

## 2013-07-06 VITALS — BP 118/64 | HR 87 | Temp 98.5°F | Resp 16 | Ht 65.0 in | Wt 198.0 lb

## 2013-07-06 DIAGNOSIS — Z0000 Encounter for general adult medical examination without abnormal findings: Secondary | ICD-10-CM

## 2013-07-06 DIAGNOSIS — Z0001 Encounter for general adult medical examination with abnormal findings: Secondary | ICD-10-CM | POA: Insufficient documentation

## 2013-07-06 DIAGNOSIS — R7309 Other abnormal glucose: Secondary | ICD-10-CM

## 2013-07-06 DIAGNOSIS — G4700 Insomnia, unspecified: Secondary | ICD-10-CM

## 2013-07-06 DIAGNOSIS — E890 Postprocedural hypothyroidism: Secondary | ICD-10-CM

## 2013-07-06 LAB — HEMOGLOBIN A1C: Hgb A1c MFr Bld: 5.4 % (ref 4.6–6.5)

## 2013-07-06 LAB — CBC WITH DIFFERENTIAL/PLATELET
BASOS PCT: 0.1 % (ref 0.0–3.0)
Basophils Absolute: 0 10*3/uL (ref 0.0–0.1)
Eosinophils Absolute: 0.1 10*3/uL (ref 0.0–0.7)
Eosinophils Relative: 1 % (ref 0.0–5.0)
HCT: 48.7 % (ref 39.0–52.0)
Hemoglobin: 16.3 g/dL (ref 13.0–17.0)
LYMPHS ABS: 1.7 10*3/uL (ref 0.7–4.0)
Lymphocytes Relative: 16.3 % (ref 12.0–46.0)
MCHC: 33.3 g/dL (ref 30.0–36.0)
MCV: 90 fl (ref 78.0–100.0)
MONO ABS: 0.4 10*3/uL (ref 0.1–1.0)
Monocytes Relative: 4.1 % (ref 3.0–12.0)
NEUTROS ABS: 8.2 10*3/uL — AB (ref 1.4–7.7)
Neutrophils Relative %: 78.5 % — ABNORMAL HIGH (ref 43.0–77.0)
Platelets: 349 10*3/uL (ref 150.0–400.0)
RBC: 5.41 Mil/uL (ref 4.22–5.81)
RDW: 13.4 % (ref 11.5–14.6)
WBC: 10.5 10*3/uL (ref 4.5–10.5)

## 2013-07-06 LAB — COMPREHENSIVE METABOLIC PANEL
ALT: 19 U/L (ref 0–53)
AST: 19 U/L (ref 0–37)
Albumin: 4.4 g/dL (ref 3.5–5.2)
Alkaline Phosphatase: 79 U/L (ref 39–117)
BUN: 14 mg/dL (ref 6–23)
CO2: 26 mEq/L (ref 19–32)
Calcium: 9.2 mg/dL (ref 8.4–10.5)
Chloride: 104 mEq/L (ref 96–112)
Creatinine, Ser: 1 mg/dL (ref 0.4–1.5)
GFR: 98.02 mL/min (ref >60.00)
Glucose, Bld: 100 mg/dL — ABNORMAL HIGH (ref 70–99)
Potassium: 4.1 mEq/L (ref 3.5–5.1)
Sodium: 138 mEq/L (ref 135–145)
Total Bilirubin: 1.1 mg/dL (ref 0.3–1.2)
Total Protein: 7.6 g/dL (ref 6.0–8.3)

## 2013-07-06 LAB — URINALYSIS, ROUTINE W REFLEX MICROSCOPIC
Bilirubin Urine: NEGATIVE
Hgb urine dipstick: NEGATIVE
Ketones, ur: NEGATIVE
LEUKOCYTES UA: NEGATIVE
NITRITE: NEGATIVE
PH: 6 (ref 5.0–8.0)
Specific Gravity, Urine: 1.025 (ref 1.000–1.030)
Total Protein, Urine: NEGATIVE
Urine Glucose: NEGATIVE
Urobilinogen, UA: 0.2 (ref 0.0–1.0)

## 2013-07-06 LAB — LIPID PANEL
Cholesterol: 154 mg/dL (ref 0–200)
HDL: 39.5 mg/dL (ref >39.00)
LDL Cholesterol: 97 mg/dL (ref 0–99)
Total CHOL/HDL Ratio: 4
Triglycerides: 86 mg/dL (ref 0.0–149.0)
VLDL: 17.2 mg/dL (ref 0.0–40.0)

## 2013-07-06 LAB — T4: T4, Total: 10.7 ug/dL (ref 5.0–12.5)

## 2013-07-06 LAB — T3, FREE: T3, Free: 2.5 pg/mL (ref 2.3–4.2)

## 2013-07-06 LAB — TSH: TSH: 19.67 u[IU]/mL — AB (ref 0.35–5.50)

## 2013-07-06 MED ORDER — LEVOTHYROXINE SODIUM 300 MCG PO TABS: 300.0000 ug | ORAL_TABLET | Freq: Every day | ORAL | Status: DC

## 2013-07-06 MED ORDER — SUVOREXANT 10 MG PO TABS: 1.0000 | ORAL_TABLET | Freq: Every evening | ORAL | Status: DC | PRN

## 2013-07-06 NOTE — Assessment & Plan Note (Signed)
His TSH is still high so I will increase the dose to 300 mcg per day

## 2013-07-06 NOTE — Progress Notes (Signed)
Pre visit review using our clinic review tool, if applicable. No additional management support is needed unless otherwise documented below in the visit note. 

## 2013-07-06 NOTE — Patient Instructions (Signed)
Health Maintenance, Males A healthy lifestyle and preventative care can promote health and wellness.  Maintain regular health, dental, and eye exams.  Eat a healthy diet. Foods like vegetables, fruits, whole grains, low-fat dairy products, and lean protein foods contain the nutrients you need and are low in calories. Decrease your intake of foods high in solid fats, added sugars, and salt. Get information about a proper diet from your health care provider, if necessary.  Regular physical exercise is one of the most important things you can do for your health. Most adults should get at least 150 minutes of moderate-intensity exercise (any activity that increases your heart rate and causes you to sweat) each week. In addition, most adults need muscle-strengthening exercises on 2 or more days a week.   Maintain a healthy weight. The body mass index (BMI) is a screening tool to identify possible weight problems. It provides an estimate of body fat based on height and weight. Your health care provider can find your BMI and can help you achieve or maintain a healthy weight. For males 20 years and older:  A BMI below 18.5 is considered underweight.  A BMI of 18.5 to 24.9 is normal.  A BMI of 25 to 29.9 is considered overweight.  A BMI of 30 and above is considered obese.  Maintain normal blood lipids and cholesterol by exercising and minimizing your intake of saturated fat. Eat a balanced diet with plenty of fruits and vegetables. Blood tests for lipids and cholesterol should begin at age 20 and be repeated every 5 years. If your lipid or cholesterol levels are high, you are over 50, or you are at high risk for heart disease, you may need your cholesterol levels checked more frequently.Ongoing high lipid and cholesterol levels should be treated with medicines, if diet and exercise are not working.  If you smoke, find out from your health care provider how to quit. If you do not use tobacco, do not  start.  Lung cancer screening is recommended for adults aged 55 80 years who are at high risk for developing lung cancer because of a history of smoking. A yearly low-dose CT scan of the lungs is recommended for people who have at least a 30-pack-year history of smoking and are a current smoker or have quit within the past 15 years. A pack year of smoking is smoking an average of 1 pack of cigarettes a day for 1 year (for example, a 30-pack-year history of smoking could mean smoking 1 pack a day for 30 years or 2 packs a day for 15 years). Yearly screening should continue until the smoker has stopped smoking for at least 15 years. Yearly screening should be stopped for people who develop a health problem that would prevent them from having lung cancer treatment.  If you choose to drink alcohol, do not have more than 2 drinks per day. One drink is considered to be 12 oz (360 mL) of beer, 5 oz (150 mL) of wine, or 1.5 oz (45 mL) of liquor.  Avoid use of street drugs. Do not share needles with anyone. Ask for help if you need support or instructions about stopping the use of drugs.  High blood pressure causes heart disease and increases the risk of stroke. Blood pressure should be checked at least every 1 2 years. Ongoing high blood pressure should be treated with medicines if weight loss and exercise are not effective.  If you are 45 24 years old, ask your health   care provider if you should take aspirin to prevent heart disease.  Diabetes screening involves taking a blood sample to check your fasting blood sugar level. This should be done once every 3 years after age 45, if you are at a normal weight and without risk factors for diabetes. Testing should be considered at a younger age or be carried out more frequently if you are overweight and have at least 1 risk factor for diabetes.  Colorectal cancer can be detected and often prevented. Most routine colorectal cancer screening begins at the age of 50  and continues through age 75. However, your health care provider may recommend screening at an earlier age if you have risk factors for colon cancer. On a yearly basis, your health care provider may provide home test kits to check for hidden blood in the stool. A small camera at the end of a tube may be used to directly examine the colon (sigmoidoscopy or colonoscopy) to detect the earliest forms of colorectal cancer. Talk to your health care provider about this at age 50, when routine screening begins. A direct exam of the colon should be repeated every 5 10 years through age 75, unless early forms of pre-cancerous polyps or small growths are found.  People who are at an increased risk for hepatitis B should be screened for this virus. You are considered at high risk for hepatitis B if:  You were born in a country where hepatitis B occurs often. Talk with your health care provider about which countries are considered high-risk.  Your parents were born in a high-risk country and you have not received a shot to protect against hepatitis B (hepatitis B vaccine).  You have HIV or AIDS.  You use needles to inject street drugs.  You live with, or have sex with, someone who has hepatitis B.  You are a man who has sex with other men (MSM).  You get hemodialysis treatment.  You take certain medicines for conditions like cancer, organ transplantation, and autoimmune conditions.  Hepatitis C blood testing is recommended for all people born from 1945 through 1965 and any individual with known risk factors for hepatitis C.  Healthy men should no longer receive prostate-specific antigen (PSA) blood tests as part of routine cancer screening. Talk to your health care provider about prostate cancer screening.  Testicular cancer screening is not recommended for adolescents or adult males who have no symptoms. Screening includes self-exam, a health care provider exam, and other screening tests. Consult with  your health care provider about any symptoms you have or any concerns you have about testicular cancer.  Practice safe sex. Use condoms and avoid high-risk sexual practices to reduce the spread of sexually transmitted infections (STIs).  Use sunscreen. Apply sunscreen liberally and repeatedly throughout the day. You should seek shade when your shadow is shorter than you. Protect yourself by wearing long sleeves, pants, a wide-brimmed hat, and sunglasses year round, whenever you are outdoors.  Tell your health care provider of new moles or changes in moles, especially if there is a change in shape or color. Also tell your provider if a mole is larger than the size of a pencil eraser.  A one-time screening for abdominal aortic aneurysm (AAA) and surgical repair of large AAAs by ultrasound is recommended for men aged 65 75 years who are current or former smokers.  Stay current with your vaccines (immunizations). Document Released: 10/18/2007 Document Revised: 02/09/2013 Document Reviewed: 09/16/2010 [employer] Patient Information 2014 [employer].   Hypothyroidism The thyroid is a large gland located in the lower front of your neck. The thyroid gland helps control metabolism. Metabolism is how your body handles food. It controls metabolism with the hormone thyroxine. When this gland is underactive (hypothyroid), it produces too little hormone.  CAUSES These include:   Absence or destruction of thyroid tissue.  Goiter due to iodine deficiency.  Goiter due to medications.  Congenital defects (since birth).  Problems with the pituitary. This causes a lack of TSH (thyroid stimulating hormone). This hormone tells the thyroid to turn out more hormone. SYMPTOMS  Lethargy (feeling as though you have no energy)  Cold intolerance  Weight gain (in spite of normal food intake)  Dry skin  Coarse hair  Menstrual irregularity (if severe, may lead to infertility)  Slowing of thought  processes Cardiac problems are also caused by insufficient amounts of thyroid hormone. Hypothyroidism in the newborn is cretinism, and is an extreme form. It is important that this form be treated adequately and immediately or it will lead rapidly to retarded physical and mental development. DIAGNOSIS  To prove hypothyroidism, your caregiver may do blood tests and ultrasound tests. Sometimes the signs are hidden. It may be necessary for your caregiver to watch this illness with blood tests either before or after diagnosis and treatment. TREATMENT  Low levels of thyroid hormone are increased by using synthetic thyroid hormone. This is a safe, effective treatment. It usually takes about four weeks to gain the full effects of the medication. After you have the full effect of the medication, it will generally take another four weeks for problems to leave. Your caregiver may start you on low doses. If you have had heart problems the dose may be gradually increased. It is generally not an emergency to get rapidly to normal. HOME CARE INSTRUCTIONS   Take your medications as your caregiver suggests. Let your caregiver know of any medications you are taking or start taking. Your caregiver will help you with dosage schedules.  As your condition improves, your dosage needs may increase. It will be necessary to have continuing blood tests as suggested by your caregiver.  Report all suspected medication side effects to your caregiver. SEEK MEDICAL CARE IF: Seek medical care if you develop:  Sweating.  Tremulousness (tremors).  Anxiety.  Rapid weight loss.  Heat intolerance.  Emotional swings.  Diarrhea.  Weakness. SEEK IMMEDIATE MEDICAL CARE IF:  You develop chest pain, an irregular heart beat (palpitations), or a rapid heart beat. MAKE SURE YOU:   Understand these instructions.  Will watch your condition.  Will get help right away if you are not doing well or get worse. Document Released:  04/21/2005 Document Revised: 07/14/2011 Document Reviewed: 12/10/2007 Exit[employer] Patient Information 2014 [employer].  

## 2013-07-06 NOTE — Progress Notes (Signed)
Subjective:    Patient ID: Brian Fowler, male    DOB: [birth year], 23 y.o.   MRN: [id number]  Thyroid Problem Presents for follow-up visit. Symptoms include anxiety, fatigue and weight gain. Patient reports no cold intolerance, constipation, depressed mood, diaphoresis, diarrhea, dry skin, hair loss, heat intolerance, hoarse voice, leg swelling, nail problem, palpitations, tremors or visual change. The symptoms have been stable. Past treatments include levothyroxine. The treatment provided moderate relief. Risk factors include prior iodine 131 therapy.      Review of Systems  Constitutional: Positive for weight gain and fatigue. Negative for fever, chills, diaphoresis, activity change, appetite change and unexpected weight change.  HENT: Negative.  Negative for hoarse voice.   Eyes: Negative.   Respiratory: Negative.  Negative for apnea, cough, choking, chest tightness, shortness of breath, wheezing and stridor.   Cardiovascular: Negative.  Negative for chest pain, palpitations and leg swelling.  Gastrointestinal: Negative.  Negative for nausea, vomiting, abdominal pain, diarrhea, constipation, blood in stool, abdominal distention, anal bleeding and rectal pain.  Endocrine: Negative.  Negative for cold intolerance and heat intolerance.  Genitourinary: Negative.  Negative for testicular pain.  Musculoskeletal: Negative.   Skin: Negative.   Allergic/Immunologic: Negative.   Neurological: Negative.  Negative for tremors.  Hematological: Negative.  Negative for adenopathy. Does not bruise/bleed easily.  Psychiatric/Behavioral: Positive for sleep disturbance (DFA). Negative for suicidal ideas, hallucinations, confusion, self-injury, dysphoric mood, decreased concentration and agitation. The patient is not nervous/anxious and is not hyperactive.        Objective:   Physical Exam  Vitals reviewed. Constitutional: He is oriented to person, place, and time. He appears well-developed and  well-nourished. No distress.  HENT:  Head: Normocephalic and atraumatic.  Mouth/Throat: Oropharynx is clear and moist. No oropharyngeal exudate.  Eyes: Conjunctivae are normal. Right eye exhibits no discharge. Left eye exhibits no discharge. No scleral icterus.  Neck: Normal range of motion. Neck supple. No JVD present. No tracheal deviation present. No thyromegaly present.  Cardiovascular: Normal rate, regular rhythm, normal heart sounds and intact distal pulses.  Exam reveals no gallop and no friction rub.   No murmur heard. Pulmonary/Chest: Effort normal and breath sounds normal. No stridor. No respiratory distress. He has no wheezes. He has no rales. He exhibits no tenderness.  Abdominal: Soft. Bowel sounds are normal. He exhibits no distension and no mass. There is no tenderness. There is no rebound and no guarding. Hernia confirmed negative in the right inguinal area and confirmed negative in the left inguinal area.  Genitourinary: Testes normal and penis normal. Right testis shows no mass, no swelling and no tenderness. Right testis is descended. Cremasteric reflex is not absent on the right side. Left testis shows no mass, no swelling and no tenderness. Left testis is descended. Cremasteric reflex is not absent on the left side. Circumcised. No penile erythema or penile tenderness. No discharge found.  ++ vitiligo  Musculoskeletal: Normal range of motion. He exhibits no edema and no tenderness.  Lymphadenopathy:    He has no cervical adenopathy.       Right: No inguinal adenopathy present.       Left: No inguinal adenopathy present.  Neurological: He is oriented to person, place, and time.  Skin: Skin is warm and dry. No rash noted. He is not diaphoretic. No erythema. No pallor.  Psychiatric: He has a normal mood and affect. His behavior is normal. Judgment and thought content normal.     Lab Results  Component Value Date  WBC 11.4* 01/20/2013   HGB 16.4 01/20/2013   HCT 47.0  01/20/2013   PLT 306 01/20/2013   GLUCOSE 116* 01/20/2013   NA 139 01/20/2013   K 4.3 01/20/2013   CL 101 01/20/2013   CREATININE 0.82 01/20/2013   BUN 14 01/20/2013   CO2 27 01/20/2013   TSH 8.221* 01/20/2013       Assessment & Plan:

## 2013-07-06 NOTE — Assessment & Plan Note (Signed)
He will try belsomra for this 

## 2013-07-06 NOTE — Assessment & Plan Note (Signed)
I will check his A1C to see if he has developed DM2 

## 2013-07-06 NOTE — Assessment & Plan Note (Signed)
Exam done Vaccines were reviewed Labs ordered Pt ed material was given 

## 2013-07-07 ENCOUNTER — Encounter: Payer: Self-pay | Admitting: Internal Medicine

## 2013-07-20 ENCOUNTER — Ambulatory Visit: Payer: 59 | Admitting: Internal Medicine

## 2013-09-05 ENCOUNTER — Other Ambulatory Visit: Payer: Self-pay | Admitting: *Deleted

## 2013-09-05 DIAGNOSIS — E890 Postprocedural hypothyroidism: Secondary | ICD-10-CM

## 2013-09-05 MED ORDER — LEVOTHYROXINE SODIUM 300 MCG PO TABS: 300.0000 ug | ORAL_TABLET | Freq: Every day | ORAL | Status: DC

## 2013-09-08 ENCOUNTER — Ambulatory Visit: Payer: 59 | Admitting: Internal Medicine

## 2013-09-08 ENCOUNTER — Other Ambulatory Visit: Payer: Self-pay

## 2013-09-08 ENCOUNTER — Telehealth: Payer: Self-pay | Admitting: Internal Medicine

## 2013-09-08 DIAGNOSIS — E890 Postprocedural hypothyroidism: Secondary | ICD-10-CM

## 2013-09-08 MED ORDER — LEVOTHYROXINE SODIUM 300 MCG PO TABS: 300.0000 ug | ORAL_TABLET | Freq: Every day | ORAL | Status: DC

## 2013-09-08 NOTE — Telephone Encounter (Signed)
Patient is requesting a written rx for thyroid level one testing to take to a different lab to have completed.  Patient had an appt scheduled for today but had to reschedule for next week.

## 2013-09-08 NOTE — Telephone Encounter (Signed)
Dad notified and son will pick up

## 2013-09-08 NOTE — Telephone Encounter (Signed)
Please advise if ok to provide printed lab orders for TSH, last collected 3/15. Thanks

## 2013-09-08 NOTE — Telephone Encounter (Signed)
yes

## 2013-09-15 ENCOUNTER — Ambulatory Visit: Payer: 59 | Admitting: Internal Medicine

## 2013-09-19 LAB — TSH: TSH: 0.01 u[IU]/mL — AB (ref <=5.90)

## 2013-09-22 ENCOUNTER — Ambulatory Visit (INDEPENDENT_AMBULATORY_CARE_PROVIDER_SITE_OTHER): Payer: 59 | Admitting: Internal Medicine

## 2013-09-22 ENCOUNTER — Encounter: Payer: Self-pay | Admitting: Internal Medicine

## 2013-09-22 VITALS — BP 120/70 | HR 65 | Temp 98.1°F | Resp 16 | Ht 65.0 in | Wt 211.2 lb

## 2013-09-22 DIAGNOSIS — E890 Postprocedural hypothyroidism: Secondary | ICD-10-CM

## 2013-09-22 DIAGNOSIS — G4700 Insomnia, unspecified: Secondary | ICD-10-CM

## 2013-09-22 MED ORDER — LEVOTHYROXINE SODIUM 137 MCG PO TABS: 274.0000 ug | ORAL_TABLET | Freq: Every day | ORAL | Status: DC

## 2013-09-22 MED ORDER — DOXEPIN HCL 6 MG PO TABS: 1.0000 | ORAL_TABLET | Freq: Every evening | ORAL | Status: DC | PRN

## 2013-09-22 NOTE — Progress Notes (Signed)
Pre visit review using our clinic review tool, if applicable. No additional management support is needed unless otherwise documented below in the visit note. 

## 2013-09-22 NOTE — Patient Instructions (Signed)

## 2013-09-22 NOTE — Progress Notes (Signed)
Subjective:    Patient ID: Brian Fowler, male    DOB: 1991-03-21, 23 y.o.   MRN: [id number]  Thyroid Problem Presents for follow-up visit. Symptoms include weight gain. Patient reports no anxiety, cold intolerance, constipation, depressed mood, diaphoresis, diarrhea, dry skin, fatigue, hair loss, heat intolerance, hoarse voice, leg swelling, nail problem, palpitations, tremors, visual change or weight loss. The symptoms have been stable. Past treatments include levothyroxine. The treatment provided moderate relief. Prior procedures include thyroidectomy.      Review of Systems  Constitutional: Positive for weight gain. Negative for fever, chills, weight loss, diaphoresis, appetite change and fatigue.  HENT: Negative.  Negative for hoarse voice.   Eyes: Negative.   Respiratory: Negative.  Negative for cough, choking, chest tightness, shortness of breath, wheezing and stridor.   Cardiovascular: Negative.  Negative for chest pain, palpitations and leg swelling.  Gastrointestinal: Negative.  Negative for nausea, vomiting, abdominal pain, diarrhea and constipation.  Endocrine: Negative.  Negative for cold intolerance and heat intolerance.  Genitourinary: Negative.   Musculoskeletal: Negative.  Negative for arthralgias, back pain, joint swelling, myalgias and neck stiffness.  Skin: Negative.  Negative for rash.  Allergic/Immunologic: Negative.   Neurological: Negative.  Negative for dizziness, tremors, weakness, light-headedness, numbness and headaches.  Hematological: Negative.  Negative for adenopathy. Does not bruise/bleed easily.  Psychiatric/Behavioral: Positive for sleep disturbance (DFA). Negative for suicidal ideas, hallucinations, behavioral problems, confusion, self-injury, dysphoric mood, decreased concentration and agitation. The patient is not nervous/anxious and is not hyperactive.        Objective:   Physical Exam  Vitals reviewed. Constitutional: He is oriented to person,  place, and time. He appears well-developed and well-nourished. No distress.  HENT:  Head: Normocephalic and atraumatic.  Mouth/Throat: Oropharynx is clear and moist. No oropharyngeal exudate.  Eyes: Conjunctivae are normal. Right eye exhibits no discharge. Left eye exhibits no discharge. No scleral icterus.  Neck: Normal range of motion. Neck supple. No JVD present. No tracheal deviation present. No thyromegaly present.  Cardiovascular: Normal rate, regular rhythm, normal heart sounds and intact distal pulses.  Exam reveals no gallop and no friction rub.   No murmur heard. Pulmonary/Chest: Effort normal and breath sounds normal. No stridor. No respiratory distress. He has no wheezes. He has no rales. He exhibits no tenderness.  Abdominal: Soft. Bowel sounds are normal. He exhibits no distension and no mass. There is no tenderness. There is no rebound and no guarding.  Musculoskeletal: Normal range of motion. He exhibits no edema and no tenderness.  Lymphadenopathy:    He has no cervical adenopathy.  Neurological: He is oriented to person, place, and time.  Skin: Skin is warm and dry. No rash noted. He is not diaphoretic. No erythema. No pallor.  Psychiatric: He has a normal mood and affect. His behavior is normal. Judgment and thought content normal. His mood appears not anxious. His speech is not rapid and/or pressured. He is not agitated, not aggressive, not slowed, not withdrawn and not combative. Cognition and memory are not impaired. He does not exhibit a depressed mood. He expresses no homicidal and no suicidal ideation. He expresses no suicidal plans and no homicidal plans. He is attentive.     Lab Results  Component Value Date   WBC 10.5 07/06/2013   HGB 16.3 07/06/2013   HCT 48.7 07/06/2013   PLT 349.0 07/06/2013   GLUCOSE 100* 07/06/2013   CHOL 154 07/06/2013   TRIG 86.0 07/06/2013   HDL 39.50 07/06/2013   LDLCALC 97 07/06/2013  ALT 19 07/06/2013   AST 19 07/06/2013   NA 138 07/06/2013   K 4.1  07/06/2013   CL 104 07/06/2013   CREATININE 1.0 07/06/2013   BUN 14 07/06/2013   CO2 26 07/06/2013   TSH 0.01* 09/19/2013   HGBA1C 5.4 07/06/2013       Assessment & Plan:

## 2013-09-23 ENCOUNTER — Encounter: Payer: Self-pay | Admitting: Internal Medicine

## 2013-09-23 NOTE — Assessment & Plan Note (Signed)
Will try silenor

## 2013-09-23 NOTE — Assessment & Plan Note (Signed)
His recent TSH was slightly suppressed so I have lowered his synthroid dose

## 2014-04-23 ENCOUNTER — Other Ambulatory Visit: Payer: Self-pay | Admitting: Internal Medicine

## 2014-05-01 ENCOUNTER — Telehealth: Payer: Self-pay | Admitting: Internal Medicine

## 2014-05-01 DIAGNOSIS — E034 Atrophy of thyroid (acquired): Secondary | ICD-10-CM

## 2014-05-01 DIAGNOSIS — E039 Hypothyroidism, unspecified: Secondary | ICD-10-CM

## 2014-05-01 MED ORDER — LEVOTHYROXINE SODIUM 137 MCG PO TABS: 274.0000 ug | ORAL_TABLET | Freq: Every day | ORAL | Status: DC

## 2014-05-01 NOTE — Telephone Encounter (Signed)
Pt has appt sched with Dr Yetta BarreJones 05/10/2014

## 2014-05-01 NOTE — Telephone Encounter (Signed)
Pt out of thyroid medication, pharmacy sent request 12/20. [facility] [address]. Pls advise.

## 2014-05-02 ENCOUNTER — Telehealth: Payer: Self-pay | Admitting: Internal Medicine

## 2014-05-02 DIAGNOSIS — E039 Hypothyroidism, unspecified: Secondary | ICD-10-CM

## 2014-05-02 MED ORDER — LEVOTHYROXINE SODIUM 137 MCG PO TABS: 274.0000 ug | ORAL_TABLET | Freq: Every day | ORAL | Status: DC

## 2014-05-02 NOTE — Addendum Note (Signed)
Addended by: Etta GrandchildJONES, Eyla Tallon L on: 05/02/2014 12:29 PM   Modules accepted: Orders

## 2014-05-02 NOTE — Telephone Encounter (Signed)
Patients mom requested a RX for Brian Fowler's lab work to be done at [facility] where father works. They get it done for free there. Son has visit scheduled for Jan. 6th and they thought it would be good if he went ahead and got his thyroid tested before his visit.

## 2014-05-02 NOTE — Addendum Note (Signed)
Addended by: Etta GrandchildJONES, THOMAS L on: 05/02/2014 12:03 PM   Modules accepted: Orders

## 2014-05-02 NOTE — Telephone Encounter (Signed)
done

## 2014-05-03 NOTE — Telephone Encounter (Signed)
Informed patient Rx for blood work to be drawn at [facility] is in the mail.

## 2014-05-03 NOTE — Telephone Encounter (Signed)
Mailed Rx for lab to get blood work to patient.

## 2014-05-10 ENCOUNTER — Ambulatory Visit: Payer: 59 | Admitting: Internal Medicine

## 2014-05-10 ENCOUNTER — Encounter: Payer: Self-pay | Admitting: Internal Medicine

## 2014-05-10 ENCOUNTER — Ambulatory Visit (INDEPENDENT_AMBULATORY_CARE_PROVIDER_SITE_OTHER): Payer: 59 | Admitting: Internal Medicine

## 2014-05-10 ENCOUNTER — Telehealth: Payer: Self-pay | Admitting: Internal Medicine

## 2014-05-10 VITALS — BP 120/76 | HR 91 | Temp 98.2°F | Resp 16 | Ht 65.0 in | Wt 232.0 lb

## 2014-05-10 DIAGNOSIS — E038 Other specified hypothyroidism: Secondary | ICD-10-CM

## 2014-05-10 DIAGNOSIS — E034 Atrophy of thyroid (acquired): Secondary | ICD-10-CM

## 2014-05-10 DIAGNOSIS — E039 Hypothyroidism, unspecified: Secondary | ICD-10-CM

## 2014-05-10 MED ORDER — LEVOTHYROXINE SODIUM 137 MCG PO TABS: 274.0000 ug | ORAL_TABLET | Freq: Every day | ORAL | Status: DC

## 2014-05-10 NOTE — Progress Notes (Signed)
   Subjective:    Patient ID: Brian Fowler, male    DOB: 09/24/1990, 24 y.o.   MRN: [id number]  Thyroid Problem Presents for follow-up visit. Patient reports no anxiety, cold intolerance, constipation, depressed mood, diaphoresis, diarrhea, dry skin, fatigue, hair loss, heat intolerance, hoarse voice, leg swelling, nail problem, palpitations, tremors, visual change, weight gain or weight loss. The symptoms have been stable. Past treatments include nothing. The treatment provided no relief.      Review of Systems  Constitutional: Negative.  Negative for fever, chills, weight loss, weight gain, diaphoresis and fatigue.  HENT: Negative.  Negative for hoarse voice.   Eyes: Negative.   Respiratory: Negative.  Negative for cough, choking, chest tightness, shortness of breath and stridor.   Cardiovascular: Negative.  Negative for chest pain, palpitations and leg swelling.  Gastrointestinal: Negative.  Negative for nausea, vomiting, abdominal pain, diarrhea, constipation and blood in stool.  Endocrine: Negative.  Negative for cold intolerance and heat intolerance.  Genitourinary: Negative.   Musculoskeletal: Negative.  Negative for myalgias, back pain, joint swelling and arthralgias.  Skin: Negative.   Allergic/Immunologic: Negative.   Neurological: Negative.  Negative for tremors.  Hematological: Negative.  Negative for adenopathy. Does not bruise/bleed easily.  Psychiatric/Behavioral: Negative.  The patient is not nervous/anxious.        Objective:   Physical Exam  Constitutional: He appears well-developed and well-nourished. No distress.  HENT:  Head: Normocephalic and atraumatic.  Mouth/Throat: Oropharynx is clear and moist. No oropharyngeal exudate.  Eyes: Conjunctivae are normal. Right eye exhibits no discharge. Left eye exhibits no discharge. No scleral icterus.  Neck: Normal range of motion. Neck supple. No JVD present. No tracheal deviation present. No thyromegaly present.    Cardiovascular: Normal rate, regular rhythm, normal heart sounds and intact distal pulses.  Exam reveals no gallop and no friction rub.   No murmur heard. Pulmonary/Chest: Effort normal and breath sounds normal. No stridor. No respiratory distress. He has no wheezes. He has no rales. He exhibits no tenderness.  Abdominal: Soft. Bowel sounds are normal. He exhibits no distension and no mass. There is no tenderness. There is no rebound and no guarding.  Musculoskeletal: Normal range of motion. He exhibits no edema or tenderness.  Lymphadenopathy:    He has no cervical adenopathy.  Skin: Skin is warm and dry. No rash noted. He is not diaphoretic. No erythema. No pallor.  Vitals reviewed.     Lab Results  Component Value Date   WBC 10.5 07/06/2013   HGB 16.3 07/06/2013   HCT 48.7 07/06/2013   PLT 349.0 07/06/2013   GLUCOSE 100* 07/06/2013   CHOL 154 07/06/2013   TRIG 86.0 07/06/2013   HDL 39.50 07/06/2013   LDLCALC 97 07/06/2013   ALT 19 07/06/2013   AST 19 07/06/2013   NA 138 07/06/2013   K 4.1 07/06/2013   CL 104 07/06/2013   CREATININE 1.0 07/06/2013   BUN 14 07/06/2013   CO2 26 07/06/2013   TSH 0.01* 09/19/2013   HGBA1C 5.4 07/06/2013      Assessment & Plan:

## 2014-05-10 NOTE — Telephone Encounter (Signed)
Mom called to state thyroid meds needs to go to [facility]. They go [facility].

## 2014-05-10 NOTE — Assessment & Plan Note (Signed)
He is doing well I will recheck his TSH and will adjust his dose if needed

## 2014-05-10 NOTE — Patient Instructions (Signed)
Hypothyroidism The thyroid is a large gland located in the lower front of your neck. The thyroid gland helps control metabolism. Metabolism is how your body handles food. It controls metabolism with the hormone thyroxine. When this gland is underactive (hypothyroid), it produces too little hormone.  CAUSES These include:   Absence or destruction of thyroid tissue.  Goiter due to iodine deficiency.  Goiter due to medications.  Congenital defects (since birth).  Problems with the pituitary. This causes a lack of TSH (thyroid stimulating hormone). This hormone tells the thyroid to turn out more hormone. SYMPTOMS  Lethargy (feeling as though you have no energy)  Cold intolerance  Weight gain (in spite of normal food intake)  Dry skin  Coarse hair  Menstrual irregularity (if severe, may lead to infertility)  Slowing of thought processes Cardiac problems are also caused by insufficient amounts of thyroid hormone. Hypothyroidism in the newborn is cretinism, and is an extreme form. It is important that this form be treated adequately and immediately or it will lead rapidly to retarded physical and mental development. DIAGNOSIS  To prove hypothyroidism, your caregiver may do blood tests and ultrasound tests. Sometimes the signs are hidden. It may be necessary for your caregiver to watch this illness with blood tests either before or after diagnosis and treatment. TREATMENT  Low levels of thyroid hormone are increased by using synthetic thyroid hormone. This is a safe, effective treatment. It usually takes about four weeks to gain the full effects of the medication. After you have the full effect of the medication, it will generally take another four weeks for problems to leave. Your caregiver may start you on low doses. If you have had heart problems the dose may be gradually increased. It is generally not an emergency to get rapidly to normal. HOME CARE INSTRUCTIONS   Take your  medications as your caregiver suggests. Let your caregiver know of any medications you are taking or start taking. Your caregiver will help you with dosage schedules.  As your condition improves, your dosage needs may increase. It will be necessary to have continuing blood tests as suggested by your caregiver.  Report all suspected medication side effects to your caregiver. SEEK MEDICAL CARE IF: Seek medical care if you develop:  Sweating.  Tremulousness (tremors).  Anxiety.  Rapid weight loss.  Heat intolerance.  Emotional swings.  Diarrhea.  Weakness. SEEK IMMEDIATE MEDICAL CARE IF:  You develop chest pain, an irregular heart beat (palpitations), or a rapid heart beat. MAKE SURE YOU:   Understand these instructions.  Will watch your condition.  Will get help right away if you are not doing well or get worse. Document Released: 04/21/2005 Document Revised: 07/14/2011 Document Reviewed: 12/10/2007 Exit[employer] Patient Information 2015 [employer]. This information is not intended to replace advice given to you by your health care provider. Make sure you discuss any questions you have with your health care provider.  

## 2014-06-30 ENCOUNTER — Telehealth: Payer: Self-pay | Admitting: Internal Medicine

## 2014-06-30 DIAGNOSIS — E034 Atrophy of thyroid (acquired): Secondary | ICD-10-CM

## 2014-06-30 DIAGNOSIS — E039 Hypothyroidism, unspecified: Secondary | ICD-10-CM

## 2014-06-30 MED ORDER — LEVOTHYROXINE SODIUM 137 MCG PO TABS: 274.0000 ug | ORAL_TABLET | Freq: Every day | ORAL | Status: DC

## 2014-06-30 NOTE — Telephone Encounter (Signed)
Pt mom called in and he needs to get a refill levothyroxine (SYNTHROID) 137 MCG tablet [[id number]  Sent to [facility]

## 2014-06-30 NOTE — Telephone Encounter (Signed)
Sent refill to [facility]...Redgie Grayer/lbm

## 2014-12-10 ENCOUNTER — Other Ambulatory Visit: Payer: Self-pay | Admitting: Internal Medicine

## 2015-02-14 ENCOUNTER — Other Ambulatory Visit: Payer: Self-pay | Admitting: Internal Medicine

## 2015-05-23 ENCOUNTER — Other Ambulatory Visit: Payer: Self-pay | Admitting: *Deleted

## 2015-05-23 ENCOUNTER — Other Ambulatory Visit: Payer: Self-pay | Admitting: Internal Medicine

## 2015-05-23 MED ORDER — LEVOTHYROXINE SODIUM 137 MCG PO TABS: ORAL_TABLET | ORAL | Status: DC

## 2015-05-23 NOTE — Telephone Encounter (Signed)
Left msg on triage stating needing a [facility]. [facility] will be shipping med out next week. Sent 30 day electronically...Raechel Chute

## 2015-06-18 ENCOUNTER — Other Ambulatory Visit: Payer: Self-pay | Admitting: Internal Medicine

## 2015-06-19 ENCOUNTER — Other Ambulatory Visit: Payer: Self-pay | Admitting: Internal Medicine

## 2015-06-19 ENCOUNTER — Telehealth: Payer: Self-pay

## 2015-06-19 MED ORDER — LEVOTHYROXINE SODIUM 137 MCG PO TABS: ORAL_TABLET | ORAL | Status: DC

## 2015-06-19 NOTE — Telephone Encounter (Signed)
[facility]

## 2015-06-19 NOTE — Telephone Encounter (Signed)
Patient to call back to schedule appt, then rx refill for synthroid can be sent----patient must have office visit and tsh labs per dr Yetta Barre before we can send refill

## 2015-06-19 NOTE — Telephone Encounter (Signed)
Has scheduled med follow up for next week.  Patient only has two pills left.  Please send enough to get him through [facility] [address].

## 2015-06-19 NOTE — Telephone Encounter (Signed)
Advised patient that rx refill request for synthroid was refused by dr Yetta Barre, patient needs to make office visit with dr Yetta Barre and also have tsh labs drawn---patient did not know his schedule right now but patient has agreed to call back to make appt when he gets to his calendar----rx refill for synthroid cannot be sent until patient calls back to schedule appt----can talk with tamara if any questions

## 2015-06-19 NOTE — Telephone Encounter (Signed)
Patient was seen in jan/2016----tsh labs have been abnormal since 2014----do you want to have patient come in and recheck tsh before i refill synthroid---please advise, thanks

## 2015-06-26 ENCOUNTER — Ambulatory Visit (INDEPENDENT_AMBULATORY_CARE_PROVIDER_SITE_OTHER): Payer: 59 | Admitting: Internal Medicine

## 2015-06-26 ENCOUNTER — Encounter: Payer: Self-pay | Admitting: Internal Medicine

## 2015-06-26 VITALS — BP 110/68 | HR 72 | Temp 97.9°F | Resp 16 | Ht 65.0 in | Wt 237.0 lb

## 2015-06-26 DIAGNOSIS — E890 Postprocedural hypothyroidism: Secondary | ICD-10-CM

## 2015-06-26 DIAGNOSIS — R7309 Other abnormal glucose: Secondary | ICD-10-CM | POA: Diagnosis not present

## 2015-06-26 LAB — BASIC METABOLIC PANEL
BUN: 14 mg/dL (ref 4–21)
Creatinine: 0.8 mg/dL (ref 0.6–1.3)
GLUCOSE: 109 mg/dL
Potassium: 4.5 mmol/L (ref 3.4–5.3)
SODIUM: 140 mmol/L (ref 137–147)

## 2015-06-26 LAB — TSH: TSH: 0 u[IU]/mL — AB (ref 0.41–5.90)

## 2015-06-26 LAB — HEMOGLOBIN A1C: Hemoglobin A1C: 5.4

## 2015-06-26 NOTE — Progress Notes (Signed)
Pre visit review using our clinic review tool, if applicable. No additional management support is needed unless otherwise documented below in the visit note. 

## 2015-06-26 NOTE — Progress Notes (Signed)
Subjective:  Patient ID: Brian Fowler, male    DOB: 01/23/1991  Age: 25 y.o. MRN: [id number]  CC: Hypothyroidism   HPI [name] presents for follow-up on hypothyroidism. He has not had his level checked in the year. He feels well and offers no complaints. He wants his labs done at [facility].  Outpatient Prescriptions Prior to Visit  Medication Sig Dispense Refill  . levothyroxine (SYNTHROID, LEVOTHROID) 137 MCG tablet Take 2 tablets by mouth  daily before breakfast 15 tablet 0   No facility-administered medications prior to visit.    ROS Review of Systems  Constitutional: Negative for fever, chills, diaphoresis, appetite change and fatigue.  HENT: Negative.   Eyes: Negative.   Respiratory: Negative.  Negative for cough, choking, chest tightness, shortness of breath and stridor.   Cardiovascular: Negative.  Negative for chest pain, palpitations and leg swelling.  Gastrointestinal: Negative.  Negative for nausea, vomiting, abdominal pain, diarrhea, constipation and blood in stool.  Endocrine: Negative.   Genitourinary: Negative.   Musculoskeletal: Negative.   Skin: Negative.   Allergic/Immunologic: Negative.   Neurological: Negative.   Hematological: Negative.  Negative for adenopathy. Does not bruise/bleed easily.  Psychiatric/Behavioral: Negative.     Objective:  BP 110/68 mmHg  Pulse 72  Temp(Src) 97.9 F (36.6 C) (Oral)  Resp 16  Ht  (1.651 m)  Wt 237 lb (107.502 kg)  BMI 39.44 kg/m2  SpO2 95%  BP Readings from Last 3 Encounters:  06/26/15 110/68  05/10/14 120/76  09/22/13 120/70    Wt Readings from Last 3 Encounters:  06/26/15 237 lb (107.502 kg)  05/10/14 232 lb (105.235 kg)  09/22/13 211 lb 3.2 oz (95.8 kg)    Physical Exam  Constitutional: He is oriented to person, place, and time. No distress.  HENT:  Mouth/Throat: Oropharynx is clear and moist. No oropharyngeal exudate.  Eyes: Conjunctivae are normal. Right eye exhibits no discharge.  Left eye exhibits no discharge. No scleral icterus.  Neck: Normal range of motion. Neck supple. No JVD present. No tracheal deviation present. No thyromegaly present.  Cardiovascular: Normal rate, regular rhythm, normal heart sounds and intact distal pulses.  Exam reveals no gallop and no friction rub.   No murmur heard. Pulmonary/Chest: Effort normal and breath sounds normal. No stridor. No respiratory distress. He has no wheezes. He has no rales. He exhibits no tenderness.  Abdominal: Soft. Bowel sounds are normal. He exhibits no distension and no mass. There is no tenderness. There is no rebound and no guarding.  Musculoskeletal: Normal range of motion. He exhibits no edema or tenderness.  Lymphadenopathy:    He has no cervical adenopathy.  Neurological: He is oriented to person, place, and time.  Skin: Skin is warm and dry. No rash noted. He is not diaphoretic. No erythema. No pallor.  Vitals reviewed.   Lab Results  Component Value Date   WBC 10.5 07/06/2013   HGB 16.3 07/06/2013   HCT 48.7 07/06/2013   PLT 349.0 07/06/2013   GLUCOSE 100* 07/06/2013   CHOL 154 07/06/2013   TRIG 86.0 07/06/2013   HDL 39.50 07/06/2013   LDLCALC 97 07/06/2013   ALT 19 07/06/2013   AST 19 07/06/2013   NA 138 07/06/2013   K 4.1 07/06/2013   CL 104 07/06/2013   CREATININE 1.0 07/06/2013   BUN 14 07/06/2013   CO2 26 07/06/2013   TSH 0.01* 09/19/2013   HGBA1C 5.4 07/06/2013    Ct Head Wo Contrast  01/20/2013  CLINICAL DATA:  Dizziness EXAM: CT HEAD WITHOUT CONTRAST TECHNIQUE: Contiguous axial images were obtained from the base of the skull through the vertex without intravenous contrast. Study was obtained within 24 hr of patient's arrival at the emergency department. COMPARISON:  None FINDINGS: Ventricles overall are normal in size and configuration. The right lateral ventricle is somewhat larger than the left lateral ventricle, a presumed anatomic variant. There is no mass, hemorrhage,  extra-axial fluid collection, or midline shift. Gray-white compartments are normal. There is no demonstrable acute infarct. Bony calvarium appears intact. The mastoid air cells are clear. IMPRESSION: Study within normal limits. Electronically Signed   By: Bretta Bang   On: 01/20/2013 15:03    Assessment & Plan:   Brian Fowler was seen today for hypothyroidism.  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 -     Cancel: TSH; Future -     TSH  Other abnormal glucose- he has a history of very mild prediabetes, will continue to monitor his blood sugar to see if he has developed type 2 diabetes that requires treatment. -     Cancel: Basic metabolic panel; Future -     Cancel: Hemoglobin A1c; Future -     Hemoglobin A1c -     Basic metabolic panel  I am having Mr. Ramthun maintain his levothyroxine.  No orders of the defined types were placed in this encounter.     Follow-up: Return in about 4 months (around 10/24/2015).  Sanda Linger, MD

## 2015-06-26 NOTE — Patient Instructions (Signed)

## 2015-06-27 ENCOUNTER — Encounter: Payer: Self-pay | Admitting: Internal Medicine

## 2015-06-27 MED ORDER — LEVOTHYROXINE SODIUM 112 MCG PO TABS: 112.0000 ug | ORAL_TABLET | Freq: Every day | ORAL | Status: DC

## 2015-06-27 NOTE — Addendum Note (Signed)
Addended by: Etta Grandchild on: 06/27/2015 03:59 PM   Modules accepted: Orders, Medications

## 2015-08-03 ENCOUNTER — Telehealth: Payer: Self-pay | Admitting: Internal Medicine

## 2015-08-03 ENCOUNTER — Encounter: Payer: Self-pay | Admitting: Family

## 2015-08-03 ENCOUNTER — Ambulatory Visit (INDEPENDENT_AMBULATORY_CARE_PROVIDER_SITE_OTHER): Payer: 59 | Admitting: Family

## 2015-08-03 ENCOUNTER — Ambulatory Visit: Payer: 59 | Admitting: Family

## 2015-08-03 VITALS — BP 104/68 | HR 76 | Temp 97.8°F | Resp 16 | Ht 65.0 in | Wt 243.0 lb

## 2015-08-03 DIAGNOSIS — S39012A Strain of muscle, fascia and tendon of lower back, initial encounter: Secondary | ICD-10-CM | POA: Diagnosis not present

## 2015-08-03 MED ORDER — NAPROXEN-ESOMEPRAZOLE 500-20 MG PO TBEC: 1.0000 | DELAYED_RELEASE_TABLET | Freq: Two times a day (BID) | ORAL | Status: DC | PRN

## 2015-08-03 MED ORDER — TIZANIDINE HCL 4 MG PO CAPS: 4.0000 mg | ORAL_CAPSULE | Freq: Three times a day (TID) | ORAL | Status: DC

## 2015-08-03 MED ORDER — DICLOFENAC SODIUM 2 % TD SOLN: 1.0000 "application " | Freq: Two times a day (BID) | TRANSDERMAL | Status: DC | PRN

## 2015-08-03 NOTE — Patient Instructions (Signed)
Thank you for choosing [facility].  Summary/Instructions:  Ice 2-3 times per day and after activity. Home exercises daily.  Pennsaid - 2x per day about 1/2 packets per dose Vimovo - 2 x per day for the next 3-5 days and then as needed.  Muscle relaxer as needed - may make your sleepy and drowsy.   Your prescription(s) have been submitted to your pharmacy or been printed and provided for you. Please take as directed and contact our office if you believe you are having problem(s) with the medication(s) or have any questions.  If your symptoms worsen or fail to improve, please contact our office for further instruction, or in case of emergency go directly to the emergency room at the closest medical facility.   Low Back Strain With Rehab A strain is an injury in which a tendon or muscle is torn. The muscles and tendons of the lower back are vulnerable to strains. However, these muscles and tendons are very strong and require a great force to be injured. Strains are classified into three categories. Grade 1 strains cause pain, but the tendon is not lengthened. Grade 2 strains include a lengthened ligament, due to the ligament being stretched or partially ruptured. With grade 2 strains there is still function, although the function may be decreased. Grade 3 strains involve a complete tear of the tendon or muscle, and function is usually impaired. SYMPTOMS   Pain in the lower back.  Pain that affects one side more than the other.  Pain that gets worse with movement and may be felt in the hip, buttocks, or back of the thigh.  Muscle spasms of the muscles in the back.  Swelling along the muscles of the back.  Loss of strength of the back muscles.  Crackling sound (crepitation) when the muscles are touched. CAUSES  Lower back strains occur when a force is placed on the muscles or tendons that is greater than they can handle. Common causes of injury include:  Prolonged overuse of the  muscle-tendon units in the lower back, usually from incorrect posture.  A single violent injury or force applied to the back. RISK INCREASES WITH:  Sports that involve twisting forces on the spine or a lot of bending at the waist (football, rugby, weightlifting, bowling, golf, tennis, speed skating, racquetball, swimming, running, gymnastics, diving).  Poor strength and flexibility.  Failure to warm up properly before activity.  Family history of lower back pain or disk disorders.  Previous back injury or surgery (especially fusion).  Poor posture with lifting, especially heavy objects.  Prolonged sitting, especially with poor posture. PREVENTION   Learn and use proper posture when sitting or lifting (maintain proper posture when sitting, lift using the knees and legs, not at the waist).  Warm up and stretch properly before activity.  Allow for adequate recovery between workouts.  Maintain physical fitness:  Strength, flexibility, and endurance.  Cardiovascular fitness. PROGNOSIS  If treated properly, lower back strains usually heal within 6 weeks. RELATED COMPLICATIONS   Recurring symptoms, resulting in a chronic problem.  Chronic inflammation, scarring, and partial muscle-tendon tear.  Delayed healing or resolution of symptoms.  Prolonged disability. TREATMENT  Treatment first involves the use of ice and medicine, to reduce pain and inflammation. The use of strengthening and stretching exercises may help reduce pain with activity. These exercises may be performed at home or with a therapist. Severe injuries may require referral to a therapist for further evaluation and treatment, such as ultrasound. Your  caregiver may advise that you wear a back brace or corset, to help reduce pain and discomfort. Often, prolonged bed rest results in greater harm then benefit. Corticosteroid injections may be recommended. However, these should be reserved for the most serious cases. It is  important to avoid using your back when lifting objects. At night, sleep on your back on a firm mattress with a pillow placed under your knees. If non-surgical treatment is unsuccessful, surgery may be needed.  MEDICATION   If pain medicine is needed, nonsteroidal anti-inflammatory medicines (aspirin and ibuprofen), or other minor pain relievers (acetaminophen), are often advised.  Do not take pain medicine for 7 days before surgery.  Prescription pain relievers may be given, if your caregiver thinks they are needed. Use only as directed and only as much as you need.  Ointments applied to the skin may be helpful.  Corticosteroid injections may be given by your caregiver. These injections should be reserved for the most serious cases, because they may only be given a certain number of times. HEAT AND COLD  Cold treatment (icing) should be applied for 10 to 15 minutes every 2 to 3 hours for inflammation and pain, and immediately after activity that aggravates your symptoms. Use ice packs or an ice massage.  Heat treatment may be used before performing stretching and strengthening activities prescribed by your caregiver, physical therapist, or athletic trainer. Use a heat pack or a warm water soak. SEEK MEDICAL CARE IF:   Symptoms get worse or do not improve in 2 to 4 weeks, despite treatment.  You develop numbness, weakness, or loss of bowel or bladder function.  New, unexplained symptoms develop. (Drugs used in treatment may produce side effects.) EXERCISES  RANGE OF MOTION (ROM) AND STRETCHING EXERCISES - Low Back Strain Most people with lower back pain will find that their symptoms get worse with excessive bending forward (flexion) or arching at the lower back (extension). The exercises which will help resolve your symptoms will focus on the opposite motion.  Your physician, physical therapist or athletic trainer will help you determine which exercises will be most helpful to resolve your  lower back pain. Do not complete any exercises without first consulting with your caregiver. Discontinue any exercises which make your symptoms worse until you speak to your caregiver.  If you have pain, numbness or tingling which travels down into your buttocks, leg or foot, the goal of the therapy is for these symptoms to move closer to your back and eventually resolve. Sometimes, these leg symptoms will get better, but your lower back pain may worsen. This is typically an indication of progress in your rehabilitation. Be very alert to any changes in your symptoms and the activities in which you participated in the 24 hours prior to the change. Sharing this information with your caregiver will allow him/her to most efficiently treat your condition.  These exercises may help you when beginning to rehabilitate your injury. Your symptoms may resolve with or without further involvement from your physician, physical therapist or athletic trainer. While completing these exercises, remember:  Restoring tissue flexibility helps normal motion to return to the joints. This allows healthier, less painful movement and activity.  An effective stretch should be held for at least 30 seconds.  A stretch should never be painful. You should only feel a gentle lengthening or release in the stretched tissue. FLEXION RANGE OF MOTION AND STRETCHING EXERCISES: STRETCH - Flexion, Single Knee to Chest   Lie on a firm bed  or floor with both legs extended in front of you.  Keeping one leg in contact with the floor, bring your opposite knee to your chest. Hold your leg in place by either grabbing behind your thigh or at your knee.  Pull until you feel a gentle stretch in your lower back. Hold __________ seconds.  Slowly release your grasp and repeat the exercise with the opposite side. Repeat __________ times. Complete this exercise __________ times per day.  STRETCH - Flexion, Double Knee to Chest   Lie on a firm bed  or floor with both legs extended in front of you.  Keeping one leg in contact with the floor, bring your opposite knee to your chest.  Tense your stomach muscles to support your back and then lift your other knee to your chest. Hold your legs in place by either grabbing behind your thighs or at your knees.  Pull both knees toward your chest until you feel a gentle stretch in your lower back. Hold __________ seconds.  Tense your stomach muscles and slowly return one leg at a time to the floor. Repeat __________ times. Complete this exercise __________ times per day.  STRETCH - Low Trunk Rotation  Lie on a firm bed or floor. Keeping your legs in front of you, bend your knees so they are both pointed toward the ceiling and your feet are flat on the floor.  Extend your arms out to the side. This will stabilize your upper body by keeping your shoulders in contact with the floor.  Gently and slowly drop both knees together to one side until you feel a gentle stretch in your lower back. Hold for __________ seconds.  Tense your stomach muscles to support your lower back as you bring your knees back to the starting position. Repeat the exercise to the other side. Repeat __________ times. Complete this exercise __________ times per day  EXTENSION RANGE OF MOTION AND FLEXIBILITY EXERCISES: STRETCH - Extension, Prone on Elbows   Lie on your stomach on the floor, a bed will be too soft. Place your palms about shoulder width apart and at the height of your head.  Place your elbows under your shoulders. If this is too painful, stack pillows under your chest.  Allow your body to relax so that your hips drop lower and make contact more completely with the floor.  Hold this position for __________ seconds.  Slowly return to lying flat on the floor. Repeat __________ times. Complete this exercise __________ times per day.  RANGE OF MOTION - Extension, Prone Press Ups  Lie on your stomach on the  floor, a bed will be too soft. Place your palms about shoulder width apart and at the height of your head.  Keeping your back as relaxed as possible, slowly straighten your elbows while keeping your hips on the floor. You may adjust the placement of your hands to maximize your comfort. As you gain motion, your hands will come more underneath your shoulders.  Hold this position __________ seconds.  Slowly return to lying flat on the floor. Repeat __________ times. Complete this exercise __________ times per day.  RANGE OF MOTION- Quadruped, Neutral Spine   Assume a hands and knees position on a firm surface. Keep your hands under your shoulders and your knees under your hips. You may place padding under your knees for comfort.  Drop your head and point your tail bone toward the ground below you. This will round out your lower back like an  angry cat. Hold this position for __________ seconds.  Slowly lift your head and release your tail bone so that your back sags into a large arch, like an old horse.  Hold this position for __________ seconds.  Repeat this until you feel limber in your lower back.  Now, find your "sweet spot." This will be the most comfortable position somewhere between the two previous positions. This is your neutral spine. Once you have found this position, tense your stomach muscles to support your lower back.  Hold this position for __________ seconds. Repeat __________ times. Complete this exercise __________ times per day.  STRENGTHENING EXERCISES - Low Back Strain These exercises may help you when beginning to rehabilitate your injury. These exercises should be done near your "sweet spot." This is the neutral, low-back arch, somewhere between fully rounded and fully arched, that is your least painful position. When performed in this safe range of motion, these exercises can be used for people who have either a flexion or extension based injury. These exercises may  resolve your symptoms with or without further involvement from your physician, physical therapist or athletic trainer. While completing these exercises, remember:   Muscles can gain both the endurance and the strength needed for everyday activities through controlled exercises.  Complete these exercises as instructed by your physician, physical therapist or athletic trainer. Increase the resistance and repetitions only as guided.  You may experience muscle soreness or fatigue, but the pain or discomfort you are trying to eliminate should never worsen during these exercises. If this pain does worsen, stop and make certain you are following the directions exactly. If the pain is still present after adjustments, discontinue the exercise until you can discuss the trouble with your caregiver. STRENGTHENING - Deep Abdominals, Pelvic Tilt  Lie on a firm bed or floor. Keeping your legs in front of you, bend your knees so they are both pointed toward the ceiling and your feet are flat on the floor.  Tense your lower abdominal muscles to press your lower back into the floor. This motion will rotate your pelvis so that your tail bone is scooping upwards rather than pointing at your feet or into the floor.  With a gentle tension and even breathing, hold this position for __________ seconds. Repeat __________ times. Complete this exercise __________ times per day.  STRENGTHENING - Abdominals, Crunches   Lie on a firm bed or floor. Keeping your legs in front of you, bend your knees so they are both pointed toward the ceiling and your feet are flat on the floor. Cross your arms over your chest.  Slightly tip your chin down without bending your neck.  Tense your abdominals and slowly lift your trunk high enough to just clear your shoulder blades. Lifting higher can put excessive stress on the lower back and does not further strengthen your abdominal muscles.  Control your return to the starting  position. Repeat __________ times. Complete this exercise __________ times per day.  STRENGTHENING - Quadruped, Opposite UE/LE Lift   Assume a hands and knees position on a firm surface. Keep your hands under your shoulders and your knees under your hips. You may place padding under your knees for comfort.  Find your neutral spine and gently tense your abdominal muscles so that you can maintain this position. Your shoulders and hips should form a rectangle that is parallel with the floor and is not twisted.  Keeping your trunk steady, lift your right hand no higher than your  shoulder and then your left leg no higher than your hip. Make sure you are not holding your breath. Hold this position __________ seconds.  Continuing to keep your abdominal muscles tense and your back steady, slowly return to your starting position. Repeat with the opposite arm and leg. Repeat __________ times. Complete this exercise __________ times per day.  STRENGTHENING - Lower Abdominals, Double Knee Lift  Lie on a firm bed or floor. Keeping your legs in front of you, bend your knees so they are both pointed toward the ceiling and your feet are flat on the floor.  Tense your abdominal muscles to brace your lower back and slowly lift both of your knees until they come over your hips. Be certain not to hold your breath.  Hold __________ seconds. Using your abdominal muscles, return to the starting position in a slow and controlled manner. Repeat __________ times. Complete this exercise __________ times per day.  POSTURE AND BODY MECHANICS CONSIDERATIONS - Low Back Strain Keeping correct posture when sitting, standing or completing your activities will reduce the stress put on different body tissues, allowing injured tissues a chance to heal and limiting painful experiences. The following are general guidelines for improved posture. Your physician or physical therapist will provide you with any instructions specific to  your needs. While reading these guidelines, remember:  The exercises prescribed by your provider will help you have the flexibility and strength to maintain correct postures.  The correct posture provides the best environment for your joints to work. All of your joints have less wear and tear when properly supported by a spine with good posture. This means you will experience a healthier, less painful body.  Correct posture must be practiced with all of your activities, especially prolonged sitting and standing. Correct posture is as important when doing repetitive low-stress activities (typing) as it is when doing a single heavy-load activity (lifting). RESTING POSITIONS Consider which positions are most painful for you when choosing a resting position. If you have pain with flexion-based activities (sitting, bending, stooping, squatting), choose a position that allows you to rest in a less flexed posture. You would want to avoid curling into a fetal position on your side. If your pain worsens with extension-based activities (prolonged standing, working overhead), avoid resting in an extended position such as sleeping on your stomach. Most people will find more comfort when they rest with their spine in a more neutral position, neither too rounded nor too arched. Lying on a non-sagging bed on your side with a pillow between your knees, or on your back with a pillow under your knees will often provide some relief. Keep in mind, being in any one position for a prolonged period of time, no matter how correct your posture, can still lead to stiffness. PROPER SITTING POSTURE In order to minimize stress and discomfort on your spine, you must sit with correct posture. Sitting with good posture should be effortless for a healthy body. Returning to good posture is a gradual process. Many people can work toward this most comfortably by using various supports until they have the flexibility and strength to maintain  this posture on their own. When sitting with proper posture, your ears will fall over your shoulders and your shoulders will fall over your hips. You should use the back of the chair to support your upper back. Your lower back will be in a neutral position, just slightly arched. You may place a small pillow or folded towel at the base  of your lower back for support.  When working at a desk, create an environment that supports good, upright posture. Without extra support, muscles tire, which leads to excessive strain on joints and other tissues. Keep these recommendations in mind: CHAIR:  A chair should be able to slide under your desk when your back makes contact with the back of the chair. This allows you to work closely.  The chair's height should allow your eyes to be level with the upper part of your monitor and your hands to be slightly lower than your elbows. BODY POSITION  Your feet should make contact with the floor. If this is not possible, use a foot rest.  Keep your ears over your shoulders. This will reduce stress on your neck and lower back. INCORRECT SITTING POSTURES  If you are feeling tired and unable to assume a healthy sitting posture, do not slouch or slump. This puts excessive strain on your back tissues, causing more damage and pain. Healthier options include:  Using more support, like a lumbar pillow.  Switching tasks to something that requires you to be upright or walking.  Talking a brief walk.  Lying down to rest in a neutral-spine position. PROLONGED STANDING WHILE SLIGHTLY LEANING FORWARD  When completing a task that requires you to lean forward while standing in one place for a long time, place either foot up on a stationary 2-4 inch high object to help maintain the best posture. When both feet are on the ground, the lower back tends to lose its slight inward curve. If this curve flattens (or becomes too large), then the back and your other joints will experience  too much stress, tire more quickly, and can cause pain. CORRECT STANDING POSTURES Proper standing posture should be assumed with all daily activities, even if they only take a few moments, like when brushing your teeth. As in sitting, your ears should fall over your shoulders and your shoulders should fall over your hips. You should keep a slight tension in your abdominal muscles to brace your spine. Your tailbone should point down to the ground, not behind your body, resulting in an over-extended swayback posture.  INCORRECT STANDING POSTURES  Common incorrect standing postures include a forward head, locked knees and/or an excessive swayback. WALKING Walk with an upright posture. Your ears, shoulders and hips should all line-up. PROLONGED ACTIVITY IN A FLEXED POSITION When completing a task that requires you to bend forward at your waist or lean over a low surface, try to find a way to stabilize 3 out of 4 of your limbs. You can place a hand or elbow on your thigh or rest a knee on the surface you are reaching across. This will provide you more stability so that your muscles do not fatigue as quickly. By keeping your knees relaxed, or slightly bent, you will also reduce stress across your lower back. CORRECT LIFTING TECHNIQUES DO :   Assume a wide stance. This will provide you more stability and the opportunity to get as close as possible to the object which you are lifting.  Tense your abdominals to brace your spine. Bend at the knees and hips. Keeping your back locked in a neutral-spine position, lift using your leg muscles. Lift with your legs, keeping your back straight.  Test the weight of unknown objects before attempting to lift them.  Try to keep your elbows locked down at your sides in order get the best strength from your shoulders when carrying an object.  Always ask for help when lifting heavy or awkward objects. INCORRECT LIFTING TECHNIQUES DO NOT:   Lock your knees when  lifting, even if it is a small object.  Bend and twist. Pivot at your feet or move your feet when needing to change directions.  Assume that you can safely pick up even a paper clip without proper posture.   This information is not intended to replace advice given to you by your health care provider. Make sure you discuss any questions you have with your health care provider.   Document Released: 04/21/2005 Document Revised: 05/12/2014 Document Reviewed: 08/03/2008 [employer] Interactive Patient Education [employer]

## 2015-08-03 NOTE — Assessment & Plan Note (Addendum)
Symptoms and exam consistent with low back strain. Treat conservatively with ice and home exercise therapy. Start Vimovo and Pennsaid. Start Zanaflex as needed for spasm. Continue back brace at work. Limit lifting and using good proper lifting technique. Follow up in 3 weeks or sooner if needed. May require additional imaging/therapy.

## 2015-08-03 NOTE — Progress Notes (Signed)
Subjective:    Patient ID: Brian Fowler, male    DOB: 02/08/1991, 25 y.o.   MRN: [id number]  Chief Complaint  Patient presents with  . 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 male who  has a past medical history of Anxiety and Thyroid disease. and presents today for an office visit.   This is a new problem. Associated symptom of pain located on the left side of his lumbar spine that has been going on for about 4 days following twisting and lifting at work. Denies any sounds or sensations heard or felt. Usually wears a back brace that fell off.  Pain is described as pinpoints that sometimes radiates upwards towards his neck and shoulders with no radiculopathy to the legs. Pain is constant with the severity waxing and waning. Worsened by sitting/standing for long periods of time. Modifying factors include Percoet which did not seem to help very much. 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  Allergies  Allergen Reactions  . Penicillins Anaphylaxis    Swelling, hives, SOB     Current Outpatient Prescriptions on File Prior to Visit  Medication Sig Dispense Refill  . levothyroxine (SYNTHROID, LEVOTHROID) 112 MCG tablet Take 1 tablet (112 mcg total) by mouth daily. 90 tablet 0   No current facility-administered medications on file prior to visit.     Past Surgical History  Procedure Laterality Date  . Tonsillectomy and adenoidectomy    . Cosmetic surgery  11/2011    for gynecomastia    Past Medical History  Diagnosis Date  . Anxiety   . Thyroid disease     hypothyroidism     Review of Systems  Constitutional: Negative for fever and chills.  Musculoskeletal: Positive for back pain.  Neurological: Negative for weakness and numbness.      Objective:    BP 104/68  mmHg  Pulse 76  Temp(Src) 97.8 F (36.6 C) (Oral)  Resp 16  Ht  (1.651 m)  Wt 243 lb (110.224 kg)  BMI 40.44 kg/m2  SpO2 96% Nursing note and vital signs reviewed.  Physical Exam  Constitutional: He is oriented to person, place, and time. He appears well-developed and well-nourished. No distress.  Cardiovascular: Normal rate, regular rhythm, normal heart sounds and intact distal pulses.   Pulmonary/Chest: Effort normal and breath sounds normal.  Musculoskeletal:  Low back - no obvious deformity, discoloration, or edema noted. Tenderness elicited along left paraspinal musculature. Range of motion is limited in flexion secondary to discomfort. Discomfort is also noted in left lateral bending and left rotation. Straight leg raise is positive. Faber's test is positive. Distal pulses and sensation are intact and appropriate.  Neurological: He is alert and oriented to person, place, and time.  Skin: Skin is warm and dry.  Psychiatric: He has a normal mood and affect. His behavior is normal. Judgment and thought content normal.       Assessment & Plan:   Problem List Items Addressed This Visit      Musculoskeletal and Integument   Low back strain - Primary    Symptoms and exam consistent with low back strain. Treat conservatively with ice and home exercise therapy. Start Vimovo and Pennsaid. Continue back brace at work. Limit  lifting and using good proper lifting technique. Follow up in 3 weeks or sooner if needed. May require additional imaging/therapy.       Relevant Medications   Naproxen-Esomeprazole 500-20 MG TBEC   Diclofenac Sodium (PENNSAID) 2 % SOLN   tiZANidine (ZANAFLEX) 4 MG capsule

## 2015-08-03 NOTE — Progress Notes (Signed)
Pre visit review using our clinic review tool, if applicable. No additional management support is needed unless otherwise documented below in the visit note. 

## 2015-08-06 ENCOUNTER — Telehealth: Payer: Self-pay | Admitting: Internal Medicine

## 2015-08-06 NOTE — Telephone Encounter (Signed)
Please advise 

## 2015-08-06 NOTE — Telephone Encounter (Signed)
The form has been faxed. Called pt to let him know. I asked pt if he wanted to come and pick the form up or if he just wanted me to go ahead and put it in his chart. He advised to go ahead and put it in his chart.

## 2015-08-06 NOTE — Telephone Encounter (Signed)
No, stay on the current dose

## 2015-08-06 NOTE — Telephone Encounter (Signed)
Pt on schedule for 2:00 tomorrow

## 2015-08-06 NOTE — Telephone Encounter (Signed)
Pt called regarding the change to his thyroid medication. He was hoping he could go back to the previous dosage. He states he has gained 21 lbs in 2 weeks and he felt the last dosage was perfect.  Pharmacy is [facility] [address]

## 2015-08-06 NOTE — Telephone Encounter (Signed)
Pt request to speak to the assistant about this. Please call him back

## 2015-08-06 NOTE — Telephone Encounter (Signed)
Pt called looking for paperwork that was suppose to be filled out by Brian Fowler for pt to go back to work by [employer]  Do you know if this has been completed or the status on it?

## 2015-08-07 ENCOUNTER — Encounter: Payer: Self-pay | Admitting: Internal Medicine

## 2015-08-07 ENCOUNTER — Ambulatory Visit (INDEPENDENT_AMBULATORY_CARE_PROVIDER_SITE_OTHER): Payer: 59 | Admitting: Internal Medicine

## 2015-08-07 VITALS — BP 104/70 | HR 77 | Temp 98.8°F | Resp 16 | Ht 65.0 in | Wt 242.0 lb

## 2015-08-07 DIAGNOSIS — E038 Other specified hypothyroidism: Secondary | ICD-10-CM

## 2015-08-07 DIAGNOSIS — S39012D Strain of muscle, fascia and tendon of lower back, subsequent encounter: Secondary | ICD-10-CM

## 2015-08-07 MED ORDER — LEVOTHYROXINE SODIUM 125 MCG PO TABS
125.0000 ug | ORAL_TABLET | Freq: Every day | ORAL | Status: DC
Start: 2015-08-07 — End: 2015-10-08

## 2015-08-07 NOTE — Progress Notes (Signed)
Pre visit review using our clinic review tool, if applicable. No additional management support is needed unless otherwise documented below in the visit note. 

## 2015-08-07 NOTE — Progress Notes (Signed)
Subjective:  Patient ID: Brian Fowler, male    DOB: [birth year]  Age: 25 y.o. MRN: [id number]  CC: Back Pain and Hypothyroidism   HPI Brian Fowler presents for Follow-up on hypothyroidism and low back pain.  He was seen a few days ago for low back pain and has been taking an anti-inflammatory and a muscle relaxer. He tells me the back pain is improving. He describes the back pain as an achy sensation that radiates up towards his neck. The pain is exacerbated by bending and activity. There is no pain at rest or when sleeping. There is no radiation into his lower extremities and he denies numbness, weakness, tingling in his lower extremities. He was told in 2010 that he had a herniated disc in his lower back but opted not to have surgery on it.  He also complains that he wants a higher dose on the levothyroxine. When I last saw him about 6 weeks ago his TSH was undetectable so I lowered his levothyroxine dose. He of fatigue and weight gain.  Outpatient Prescriptions Prior to Visit  Medication Sig Dispense Refill  . Diclofenac Sodium (PENNSAID) 2 % SOLN Place 1 application onto the skin 2 (two) times daily as needed. 112 g 1  . Naproxen-Esomeprazole 500-20 MG TBEC Take 1 tablet by mouth 2 (two) times daily as needed. 60 tablet 0  . tiZANidine (ZANAFLEX) 4 MG capsule Take 1 capsule (4 mg total) by mouth 3 (three) times daily. 40 capsule 0  . levothyroxine (SYNTHROID, LEVOTHROID) 112 MCG tablet Take 1 tablet (112 mcg total) by mouth daily. (Patient not taking: Reported on 08/07/2015) 90 tablet 0   No facility-administered medications prior to visit.    ROS Review of Systems  Constitutional: Positive for fatigue and unexpected weight change. Negative for fever, chills, diaphoresis and appetite change.  HENT: Negative.   Eyes: Negative.   Respiratory: Negative.  Negative for cough, choking, chest tightness, shortness of breath and stridor.   Cardiovascular: Negative.  Negative for chest  pain, palpitations and leg swelling.  Gastrointestinal: Negative.  Negative for nausea, vomiting, abdominal pain, diarrhea and constipation.  Endocrine: Negative.   Genitourinary: Negative.   Musculoskeletal: Positive for back pain. Negative for myalgias, joint swelling, arthralgias and neck stiffness.  Skin: Negative.  Negative for color change and rash.  Allergic/Immunologic: Negative.   Neurological: Negative.  Negative for dizziness, tremors, weakness, light-headedness, numbness and headaches.  Hematological: Negative.  Negative for adenopathy. Does not bruise/bleed easily.  Psychiatric/Behavioral: Negative.     Objective:  BP 104/70 mmHg  Pulse 77  Temp(Src) 98.8 F (37.1 C) (Oral)  Resp 16  Ht  (1.651 m)  Wt 242 lb (109.77 kg)  BMI 40.27 kg/m2  SpO2 98%  BP Readings from Last 3 Encounters:  08/07/15 104/70  08/03/15 104/68  06/26/15 110/68    Wt Readings from Last 3 Encounters:  08/07/15 242 lb (109.77 kg)  08/03/15 243 lb (110.224 kg)  06/26/15 237 lb (107.502 kg)    Physical Exam  Constitutional: He is oriented to person, place, and time. He appears well-developed and well-nourished. No distress.  HENT:  Mouth/Throat: Oropharynx is clear and moist. No oropharyngeal exudate.  Eyes: Conjunctivae are normal. Right eye exhibits no discharge. Left eye exhibits no discharge. No scleral icterus.  Neck: Normal range of motion. Neck supple. No JVD present. No tracheal deviation present. No thyromegaly present.  Cardiovascular: Normal rate, regular rhythm, normal heart sounds and intact distal pulses.  Exam reveals no gallop  and no friction rub.   No murmur heard. Pulmonary/Chest: Effort normal and breath sounds normal. No stridor. No respiratory distress. He has no wheezes. He has no rales. He exhibits no tenderness.  Abdominal: Soft. Bowel sounds are normal. He exhibits no distension and no mass. There is no tenderness. There is no rebound and no guarding.    Musculoskeletal: Normal range of motion. He exhibits no edema or tenderness.       Lumbar back: Normal. He exhibits normal range of motion, no tenderness, no bony tenderness, no swelling, no edema and no deformity.  Lymphadenopathy:    He has no cervical adenopathy.  Neurological: He is alert and oriented to person, place, and time. He has normal reflexes. He displays normal reflexes. No cranial nerve deficit. He exhibits normal muscle tone. Coordination normal.  Neg SLR in BLE  Skin: Skin is warm and dry. No rash noted. He is not diaphoretic. No erythema. No pallor.  Psychiatric: He has a normal mood and affect. His behavior is normal. Judgment and thought content normal.  Vitals reviewed.   Lab Results  Component Value Date   WBC 10.5 07/06/2013   HGB 16.3 07/06/2013   HCT 48.7 07/06/2013   PLT 349.0 07/06/2013   GLUCOSE 100* 07/06/2013   CHOL 154 07/06/2013   TRIG 86.0 07/06/2013   HDL 39.50 07/06/2013   LDLCALC 97 07/06/2013   ALT 19 07/06/2013   AST 19 07/06/2013   NA 140 06/26/2015   K 4.5 06/26/2015   CL 104 07/06/2013   CREATININE 0.8 06/26/2015   BUN 14 06/26/2015   CO2 26 07/06/2013   TSH 0.00* 06/26/2015   HGBA1C 5.4 06/26/2015    Ct Head Wo Contrast  01/20/2013  CLINICAL DATA:  Dizziness EXAM: CT HEAD WITHOUT CONTRAST TECHNIQUE: Contiguous axial images were obtained from the base of the skull through the vertex without intravenous contrast. Study was obtained within 24 hr of patient's arrival at the emergency department. COMPARISON:  None FINDINGS: Ventricles overall are normal in size and configuration. The right lateral ventricle is somewhat larger than the left lateral ventricle, a presumed anatomic variant. There is no mass, hemorrhage, extra-axial fluid collection, or midline shift. Gray-white compartments are normal. There is no demonstrable acute infarct. Bony calvarium appears intact. The mastoid air cells are clear. IMPRESSION: Study within normal limits.  Electronically Signed   By: Bretta Bang   On: 01/20/2013 15:03    Assessment & Plan:   [name] was seen today for back pain and hypothyroidism.  Diagnoses and all orders for this visit:  Other specified hypothyroidism- Dose increased as requested, he will need to return in 2-3 months for repeat TSH level -     levothyroxine (SYNTHROID, LEVOTHROID) 125 MCG tablet; Take 1 tablet (125 mcg total) by mouth daily.  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 He will let me know if he develops any new or worsening symptoms.   I have discontinued Mr. Geske levothyroxine. I am also having him start on levothyroxine. Additionally, I am having him maintain his Naproxen-Esomeprazole, Diclofenac Sodium, and tiZANidine.  Meds ordered this encounter  Medications  . levothyroxine (SYNTHROID, LEVOTHROID) 125 MCG tablet    Sig: Take 1 tablet (125 mcg total) by mouth daily.    Dispense:  90 tablet    Refill:  1     Follow-up: Return in about 2  months (around 10/07/2015).  Sanda Lingerhomas Demonte Dobratz, MD

## 2015-08-07 NOTE — Telephone Encounter (Signed)
Form has been sent to scan.

## 2015-08-07 NOTE — Patient Instructions (Signed)

## 2015-08-07 NOTE — Telephone Encounter (Signed)
Patient would now like the form faxed to [employer], [phone]

## 2015-08-14 ENCOUNTER — Telehealth: Payer: Self-pay | Admitting: Internal Medicine

## 2015-08-14 NOTE — Telephone Encounter (Signed)
Pt just had an Ov regarding this. Please advise

## 2015-08-14 NOTE — Telephone Encounter (Signed)
Patient states he is having trouble with new thyroid medication that Dr. Yetta BarreJones placed him on.  He states that he has gained 10 pounds in the last week and that he is having really weird mood swings and dreams.  He would like to be placed on levothyroxine 137 mcg.  Please follow up with patient in regards.

## 2015-08-14 NOTE — Telephone Encounter (Signed)
No, I will not increase the dose

## 2015-08-16 NOTE — Telephone Encounter (Signed)
Pt refuses to continue on current dose. Explained PCP wants him on current dose. Will route to PCP for fyi. Pt states he will follow up with office

## 2015-08-16 NOTE — Telephone Encounter (Signed)
Coming in for an OV will not change anything from my standpoint

## 2015-08-16 NOTE — Telephone Encounter (Signed)
Noted. [facility] to [facility], or I could route msg to back pt declined all options and wanted to go over options with his parents

## 2015-09-06 ENCOUNTER — Telehealth: Payer: Self-pay | Admitting: Internal Medicine

## 2015-09-06 NOTE — Telephone Encounter (Signed)
Pt already informed Dr. Yetta BarreJones not willing to change his dosage see prior phone note He was offered another office visit and he declined he stated he would discuss this with his paretns or may look at another pcp. PCP going on vacation. Not sure next step

## 2015-09-06 NOTE — Telephone Encounter (Signed)
Pt called in and said that he is having bad mood swings and gained 35 pounds on this med. levothyroxine (SYNTHROID, LEVOTHROID) 125 MCG tablet [[id number]][[id number]] .  He is saying that is it needs to be increased or change asap   Best number [phone]

## 2015-09-06 NOTE — Telephone Encounter (Signed)
He may need to find a new doctor

## 2015-09-07 ENCOUNTER — Telehealth: Payer: Self-pay | Admitting: Internal Medicine

## 2015-09-07 DIAGNOSIS — E038 Other specified hypothyroidism: Secondary | ICD-10-CM

## 2015-09-07 NOTE — Telephone Encounter (Signed)
Called pt and told him what Dr Maudry Mayhewjone stated about the Dosage not being changed.  He said ok and just a for a refill on is thyroid meds.

## 2015-09-07 NOTE — Telephone Encounter (Signed)
Refill not appropriate at this time, Sent Decline to Pharmacy, Called to inform pt but he states he already got refill? Not sure how possible since a 90 day was sent on 4/4?

## 2015-10-08 ENCOUNTER — Other Ambulatory Visit: Payer: Self-pay | Admitting: *Deleted

## 2015-10-08 DIAGNOSIS — E038 Other specified hypothyroidism: Secondary | ICD-10-CM

## 2015-10-08 MED ORDER — LEVOTHYROXINE SODIUM 125 MCG PO TABS: 125.0000 ug | ORAL_TABLET | Freq: Every day | ORAL | Status: DC

## 2015-10-08 NOTE — Telephone Encounter (Signed)
Needing rx sent to [facility] on levothyroxine. No longer can get from local must use mail service. Sent electronically...Raechel Chute/lmb

## 2015-10-10 ENCOUNTER — Other Ambulatory Visit: Payer: Self-pay

## 2015-10-10 DIAGNOSIS — E038 Other specified hypothyroidism: Secondary | ICD-10-CM

## 2015-10-10 MED ORDER — LEVOTHYROXINE SODIUM 125 MCG PO TABS: 125.0000 ug | ORAL_TABLET | Freq: Every day | ORAL | Status: DC

## 2016-02-19 ENCOUNTER — Telehealth: Payer: Self-pay

## 2016-02-19 NOTE — Telephone Encounter (Signed)
[facility] Denied. Rx sent to [facility] on 10/10/2015. #90 with 2 refills.

## 2016-02-21 ENCOUNTER — Telehealth: Payer: Self-pay | Admitting: Internal Medicine

## 2016-02-21 NOTE — Telephone Encounter (Signed)
Pt mom called in and said that it time for him to have blood work, can Dr Yetta BarreJones write a script for blood work for pt to [facility]?

## 2016-02-21 NOTE — Telephone Encounter (Signed)
If okay to order I can print [facility] requisitions. Let me know what labs if approved.

## 2016-02-23 NOTE — Telephone Encounter (Signed)
No, he will have to be seen here first. Also, I am not [facility]. The patient will have to get his lab work done here.

## 2016-02-26 NOTE — Telephone Encounter (Signed)
LVM for pt to call back as soon as possible.   

## 2016-02-26 NOTE — Telephone Encounter (Signed)
Patient called back in. Advised. The patient cancelled his appt. States that it costs 400 dollars to get it done here when he can have it done for free @ [facility]. Advised that I would convey the message

## 2016-02-27 NOTE — Telephone Encounter (Signed)
Please call patients mom back in regard for this at [phone].

## 2016-02-28 ENCOUNTER — Ambulatory Visit: Payer: 59 | Admitting: Internal Medicine

## 2016-02-28 NOTE — Telephone Encounter (Signed)
Called pt mother. Informed same information. Pt will call back for an appt.

## 2016-03-04 ENCOUNTER — Telehealth: Payer: Self-pay | Admitting: Internal Medicine

## 2016-03-04 NOTE — Telephone Encounter (Signed)
Patients mother is calling to request that you accept this patient, her husband Casimiro Needlemichael, her Revonda Standard(Allison), and her other son as patients. All 4 are current patients of dr Yetta Barrejones. Casimiro NeedleMichael works for [employer], and gets free lab work as long as we prescribe to have it done at [employer].   Dr Yetta Barrejones will no longer send the patients to [facility] for lab work, and they advise that they need this. It saves thousands per year.   Are you able to accept these 4 as patients?

## 2016-03-04 NOTE — Telephone Encounter (Signed)
Can we explaiin how this saves money, as I dont think there are separate lab fees here as it should be covered by insurance as well.  thanks

## 2016-03-11 NOTE — Telephone Encounter (Signed)
Done hardcopy to Corinne  

## 2016-03-11 NOTE — Telephone Encounter (Signed)
I was advised that the insurance subscriber is employed at [employer]. To get their employees to use their own services, they offer free labwork at [facility]. Anywhere else goes to a Psychologist, counsellingdeductible accumulator

## 2016-03-11 NOTE — Telephone Encounter (Signed)
Yes, ok with me 

## 2016-03-11 NOTE — Telephone Encounter (Signed)
[facility]

## 2016-03-11 NOTE — Telephone Encounter (Signed)
It is inconvenient for the MD, but having labs done at [facility] is OK and will not significantly affect care  OK to accept pt's, only if OK with Dr Yetta BarreJones

## 2016-03-11 NOTE — Telephone Encounter (Signed)
See below

## 2016-03-11 NOTE — Telephone Encounter (Signed)
I appreciate that. My question is, however, are you able to accept these 4 patients on that are needing written lab scrips like that? If so, ill schedule them for CPE's in 2018  ty

## 2016-04-08 ENCOUNTER — Encounter: Payer: Self-pay | Admitting: Internal Medicine

## 2016-04-08 ENCOUNTER — Ambulatory Visit (INDEPENDENT_AMBULATORY_CARE_PROVIDER_SITE_OTHER): Payer: 59 | Admitting: Internal Medicine

## 2016-04-08 VITALS — BP 128/78 | HR 82 | Temp 98.1°F | Resp 20 | Wt 251.0 lb

## 2016-04-08 DIAGNOSIS — R7309 Other abnormal glucose: Secondary | ICD-10-CM | POA: Diagnosis not present

## 2016-04-08 DIAGNOSIS — Z0000 Encounter for general adult medical examination without abnormal findings: Secondary | ICD-10-CM

## 2016-04-08 DIAGNOSIS — E890 Postprocedural hypothyroidism: Secondary | ICD-10-CM

## 2016-04-08 NOTE — Progress Notes (Signed)
Subjective:    Patient ID: Brian Fowler, male    DOB: [birth year], 25 y.o.   MRN: [id number]  HPI  Here for wellness and f/u;  Overall doing ok;  Pt denies Chest pain, worsening SOB, DOE, wheezing, orthopnea, PND, worsening LE edema, palpitations, dizziness or syncope.  Pt denies neurological change such as new headache, facial or extremity weakness.  Pt denies polydipsia, polyuria, or low sugar symptoms. Pt states overall good compliance with treatment and medications, good tolerability, and has been trying to follow appropriate diet.  Pt denies worsening depressive symptoms, suicidal ideation or panic. No fever, night sweats, wt loss, loss of appetite, or other constitutional symptoms.  Pt states good ability with ADL's, has low fall risk, home safety reviewed and adequate, no other significant changes in hearing or vision, and only occasionally active with exercise.  Has gained wt in 6 mo, wondering if dose thyroid med is correct.    Labs are done without copay at [facility]. Wt Readings from Last 3 Encounters:  04/08/16 251 lb (113.9 kg)  08/07/15 242 lb (109.8 kg)  08/03/15 243 lb (110.2 kg)  S/p flu shot Past Medical History:  Diagnosis Date  . Anxiety   . Thyroid disease    hypothyroidism    Past Surgical History:  Procedure Laterality Date  . COSMETIC SURGERY  11/2011   for gynecomastia  . TONSILLECTOMY AND ADENOIDECTOMY      reports that he has never smoked. He does not have any smokeless tobacco history on file. He reports that he drinks about 0.5 oz of alcohol per week . He reports that he does not use drugs. family history includes Breast cancer in his mother; Cancer in his mother; Diabetes in his paternal grandfather; Hypertension in his father. Allergies  Allergen Reactions  . Penicillins Anaphylaxis    Swelling, hives, SOB   Current Outpatient Prescriptions on File Prior to Visit  Medication Sig Dispense Refill  . levothyroxine (SYNTHROID, LEVOTHROID) 125 MCG tablet Take  1 tablet (125 mcg total) by mouth daily. 90 tablet 2   No current facility-administered medications on file prior to visit.     Review of Systems Constitutional: Negative for increased diaphoresis, or other activity, appetite or siginficant weight change other than noted HENT: Negative for worsening hearing loss, ear pain, facial swelling, mouth sores and neck stiffness.   Eyes: Negative for other worsening pain, redness or visual disturbance.  Respiratory: Negative for choking or stridor Cardiovascular: Negative for other chest pain and palpitations.  Gastrointestinal: Negative for worsening diarrhea, blood in stool, or abdominal distention Genitourinary: Negative for hematuria, flank pain or change in urine volume.  Musculoskeletal: Negative for myalgias or other joint complaints.  Skin: Negative for other color change and wound or drainage.  Neurological: Negative for syncope and numbness. other than noted Hematological: Negative for adenopathy. or other swelling Psychiatric/Behavioral: Negative for hallucinations, SI, self-injury, decreased concentration or other worsening agitation.  All other system neg per pt    Objective:   Physical Exam BP 128/78   Pulse 82   Temp 98.1 F (36.7 C) (Oral)   Resp 20   Wt 251 lb (113.9 kg)   SpO2 98%   BMI 41.77 kg/m  VS noted,  Constitutional: Pt is oriented to person, place, and time. Appears well-developed and well-nourished, in no significant distress Head: Normocephalic and atraumatic  Eyes: Conjunctivae and EOM are normal. Pupils are equal, round, and reactive to light Right Ear: External ear normal.  Left Ear: External ear  normal Nose: Nose normal.  Mouth/Throat: Oropharynx is clear and moist  Neck: Normal range of motion. Neck supple. No JVD present. No tracheal deviation present or significant neck LA or mass Cardiovascular: Normal rate, regular rhythm, normal heart sounds and intact distal pulses.   Pulmonary/Chest: Effort  normal and breath sounds without rales or wheezing  Abdominal: Soft. Bowel sounds are normal. NT. No HSM  Musculoskeletal: Normal range of motion. Exhibits no edema Lymphadenopathy: Has no cervical adenopathy.  Neurological: Pt is alert and oriented to person, place, and time. Pt has normal reflexes. No cranial nerve deficit. Motor grossly intact Skin: Skin is warm and dry. No rash noted or new ulcers Psychiatric:  Has normal mood and affect. Behavior is normal.  No other new exam findings    Assessment & Plan:

## 2016-04-08 NOTE — Patient Instructions (Signed)
Please continue all other medications as before, and refills have been done if requested.  Please have the pharmacy call with any other refills you may need.  Please continue your efforts at being more active, low cholesterol diet, and weight control.  You are otherwise up to date with prevention measures today.  Please keep your appointments with your specialists as you may have planned  Please go to the LAB in the [facility] with the prescription orders for the tests to be done at your convenience  You will be contacted by phone if any changes need to be made immediately.  Otherwise, you will receive a letter about your results with an explanation, but please check with MyChart first.  Please remember to sign up for MyChart if you have not done so, as this will be important to you in the future with finding out test results, communicating by private email, and scheduling acute appointments online when needed.  Please return in 1 year for your yearly visit, or sooner if needed

## 2016-04-08 NOTE — Progress Notes (Signed)
Pre visit review using our clinic review tool, if applicable. No additional management support is needed unless otherwise documented below in the visit note. 

## 2016-04-14 NOTE — Assessment & Plan Note (Signed)

## 2016-04-14 NOTE — Assessment & Plan Note (Signed)
With recent mild wt gain, for thyroid f/u testing.

## 2016-04-14 NOTE — Assessment & Plan Note (Signed)
stable overall by history and exam, recent data reviewed with pt, and pt to continue medical treatment as before,  to f/u any worsening symptoms or concerns Lab Results  Component Value Date   HGBA1C 5.4 06/26/2015

## 2016-04-16 ENCOUNTER — Encounter: Payer: Self-pay | Admitting: Internal Medicine

## 2016-04-18 ENCOUNTER — Other Ambulatory Visit: Payer: Self-pay | Admitting: Internal Medicine

## 2016-04-18 DIAGNOSIS — E038 Other specified hypothyroidism: Secondary | ICD-10-CM

## 2017-05-12 ENCOUNTER — Telehealth: Payer: Self-pay | Admitting: Internal Medicine

## 2017-05-12 NOTE — Telephone Encounter (Signed)
Scheduled patient appt.

## 2017-05-12 NOTE — Telephone Encounter (Signed)
Copied from CRM [id number]. Topic: Quick Communication - See Telephone Encounter >> May 12, 2017  1:30 PM Louie BunPalacios Medina, Rosey Batheresa D wrote: CRM for notification. See Telephone encounter for: 05/12/17. Patient needs to talk to CMA about his work restriction for his new job. Please call patient back, thanks.

## 2017-05-15 ENCOUNTER — Encounter: Payer: Self-pay | Admitting: Internal Medicine

## 2017-05-15 ENCOUNTER — Ambulatory Visit: Payer: BLUE CROSS/BLUE SHIELD | Admitting: Internal Medicine

## 2017-05-15 ENCOUNTER — Other Ambulatory Visit (INDEPENDENT_AMBULATORY_CARE_PROVIDER_SITE_OTHER): Payer: BLUE CROSS/BLUE SHIELD

## 2017-05-15 VITALS — BP 110/70 | HR 104 | Temp 98.4°F | Ht 65.0 in | Wt 274.0 lb

## 2017-05-15 DIAGNOSIS — S39012D Strain of muscle, fascia and tendon of lower back, subsequent encounter: Secondary | ICD-10-CM

## 2017-05-15 DIAGNOSIS — Z0000 Encounter for general adult medical examination without abnormal findings: Secondary | ICD-10-CM

## 2017-05-15 DIAGNOSIS — Z114 Encounter for screening for human immunodeficiency virus [HIV]: Secondary | ICD-10-CM

## 2017-05-15 DIAGNOSIS — R7309 Other abnormal glucose: Secondary | ICD-10-CM

## 2017-05-15 DIAGNOSIS — E039 Hypothyroidism, unspecified: Secondary | ICD-10-CM | POA: Diagnosis not present

## 2017-05-15 LAB — URINALYSIS, ROUTINE W REFLEX MICROSCOPIC
BILIRUBIN URINE: NEGATIVE
HGB URINE DIPSTICK: NEGATIVE
Ketones, ur: NEGATIVE
Leukocytes, UA: NEGATIVE
Nitrite: NEGATIVE
Specific Gravity, Urine: 1.025 (ref 1.000–1.030)
TOTAL PROTEIN, URINE-UPE24: NEGATIVE
UROBILINOGEN UA: 0.2 (ref 0.0–1.0)
Urine Glucose: NEGATIVE
pH: 5 (ref 5.0–8.0)

## 2017-05-15 LAB — HEPATIC FUNCTION PANEL
ALBUMIN: 4.7 g/dL (ref 3.5–5.2)
ALT: 30 U/L (ref 0–53)
AST: 20 U/L (ref 0–37)
Alkaline Phosphatase: 91 U/L (ref 39–117)
BILIRUBIN TOTAL: 0.6 mg/dL (ref 0.2–1.2)
Bilirubin, Direct: 0.1 mg/dL (ref 0.0–0.3)
Total Protein: 7.8 g/dL (ref 6.0–8.3)

## 2017-05-15 LAB — BASIC METABOLIC PANEL
BUN: 16 mg/dL (ref 6–23)
CALCIUM: 9.4 mg/dL (ref 8.4–10.5)
CO2: 33 mEq/L — ABNORMAL HIGH (ref 19–32)
Chloride: 102 mEq/L (ref 96–112)
Creatinine, Ser: 1.09 mg/dL (ref 0.40–1.50)
GFR: 86.04 mL/min (ref >60.00)
Glucose, Bld: 96 mg/dL (ref 70–99)
POTASSIUM: 4.3 meq/L (ref 3.5–5.1)
Sodium: 140 mEq/L (ref 135–145)

## 2017-05-15 LAB — LIPID PANEL
CHOLESTEROL: 175 mg/dL (ref 0–200)
HDL: 35.2 mg/dL — AB (ref >39.00)
LDL Cholesterol: 107 mg/dL — ABNORMAL HIGH (ref 0–99)
NonHDL: 139.31
TRIGLYCERIDES: 161 mg/dL — AB (ref 0.0–149.0)
Total CHOL/HDL Ratio: 5
VLDL: 32.2 mg/dL (ref 0.0–40.0)

## 2017-05-15 LAB — CBC WITH DIFFERENTIAL/PLATELET
BASOS ABS: 0 10*3/uL (ref 0.0–0.1)
Basophils Relative: 0.5 % (ref 0.0–3.0)
EOS ABS: 0.3 10*3/uL (ref 0.0–0.7)
Eosinophils Relative: 3.1 % (ref 0.0–5.0)
HEMATOCRIT: 47.3 % (ref 39.0–52.0)
Hemoglobin: 15.7 g/dL (ref 13.0–17.0)
LYMPHS PCT: 24.9 % (ref 12.0–46.0)
Lymphs Abs: 2.4 10*3/uL (ref 0.7–4.0)
MCHC: 33.3 g/dL (ref 30.0–36.0)
MCV: 86.8 fl (ref 78.0–100.0)
Monocytes Absolute: 0.5 10*3/uL (ref 0.1–1.0)
Monocytes Relative: 5.1 % (ref 3.0–12.0)
NEUTROS ABS: 6.5 10*3/uL (ref 1.4–7.7)
Neutrophils Relative %: 66.4 % (ref 43.0–77.0)
PLATELETS: 335 10*3/uL (ref 150.0–400.0)
RBC: 5.45 Mil/uL (ref 4.22–5.81)
RDW: 14.3 % (ref 11.5–15.5)
WBC: 9.7 10*3/uL (ref 4.0–10.5)

## 2017-05-15 LAB — TSH: TSH: 37.06 u[IU]/mL — AB (ref 0.35–4.50)

## 2017-05-15 LAB — T4, FREE: Free T4: 1.02 ng/dL (ref 0.60–1.60)

## 2017-05-15 NOTE — Progress Notes (Signed)
Subjective:    Patient ID: Brian Fowler, male    DOB: 06-Mar-1991, 27 y.o.   MRN: [id number]  HPI  Here for wellness and f/u;  Overall doing ok;  Pt denies Chest pain, worsening SOB, DOE, wheezing, orthopnea, PND, worsening LE edema, palpitations, dizziness or syncope.  Pt denies neurological change such as new headache, facial or extremity weakness.  Pt denies polydipsia, polyuria, or low sugar symptoms. Pt states overall good compliance with treatment and medications, good tolerability, and has been trying to follow appropriate diet.  Pt denies worsening depressive symptoms, suicidal ideation or panic. No fever, night sweats, wt loss, loss of appetite, or other constitutional symptoms.  Pt states good ability with ADL's, has low fall risk, home safety reviewed and adequate, no other significant changes in hearing or vision, and only occasionally active with exercise. Gained wt with too many carbs per pt, plans to go to gym,peak wt has about 376 lbs and lost wt with current thyroid med but sort of new plateua Wt Readings from Last 3 Encounters:  05/15/17 274 lb (124.3 kg)  04/08/16 251 lb (113.9 kg)  08/07/15 242 lb (109.8 kg)  Starting new job, has hx of chornic lumbar disc dz, needs letter to state restriction level to bending, twisting , reaching and crawling and lifting  Pt requests 50 lb limit on lifting, and 10 min breaks for back pain flareup at work up to 3 per day.  Pt delcines need for other restrictions.  Pt continues to have recurring LBP without change in severity, bowel or bladder change, fever, wt loss,  worsening LE pain/numbness/weakness, gait change or falls. Past Medical History:  Diagnosis Date  . Anxiety   . Thyroid disease    hypothyroidism    Past Surgical History:  Procedure Laterality Date  . COSMETIC SURGERY  11/2011   for gynecomastia  . TONSILLECTOMY AND ADENOIDECTOMY      reports that  has never smoked. he has never used smokeless tobacco. He reports that he  drinks about 0.5 oz of alcohol per week. He reports that he does not use drugs. family history includes Arthritis in his unknown relative; Breast cancer in his mother; Cancer in his mother; Diabetes in his paternal grandfather; Hyperlipidemia in his unknown relative; Hypertension in his father and unknown relative; Kidney disease in his unknown relative; Prostate cancer in his unknown relative. Allergies  Allergen Reactions  . Penicillins Anaphylaxis    Swelling, hives, SOB   Current Outpatient Medications on File Prior to Visit  Medication Sig Dispense Refill  . levothyroxine (SYNTHROID, LEVOTHROID) 125 MCG tablet TAKE 1 TABLET BY MOUTH EVERY DAY 90 tablet 3   No current facility-administered medications on file prior to visit.    Review of Systems Constitutional: Negative for other unusual diaphoresis, sweats, appetite or weight changes HENT: Negative for other worsening hearing loss, ear pain, facial swelling, mouth sores or neck stiffness.   Eyes: Negative for other worsening pain, redness or other visual disturbance.  Respiratory: Negative for other stridor or swelling Cardiovascular: Negative for other palpitations or other chest pain  Gastrointestinal: Negative for worsening diarrhea or loose stools, blood in stool, distention or other pain Genitourinary: Negative for hematuria, flank pain or other change in urine volume.  Musculoskeletal: Negative for myalgias or other joint swelling.  Skin: Negative for other color change, or other wound or worsening drainage.  Neurological: Negative for other syncope or numbness. Hematological: Negative for other adenopathy or swelling Psychiatric/Behavioral: Negative for hallucinations, other worsening  agitation, SI, self-injury, or new decreased concentration All other system neg per pt    Objective:   Physical Exam BP 110/70   Pulse (!) 104   Temp 98.4 F (36.9 C) (Oral)   Ht 5\' 5"  (1.651 m)   Wt 274 lb (124.3 kg)   SpO2 99%   BMI  45.60 kg/m  VS noted, morbid obese Constitutional: Pt is oriented to person, place, and time. Appears well-developed and well-nourished, in no significant distress and comfortable Head: Normocephalic and atraumatic  Eyes: Conjunctivae and EOM are normal. Pupils are equal, round, and reactive to light Right Ear: External ear normal without discharge Left Ear: External ear normal without discharge Nose: Nose without discharge or deformity Mouth/Throat: Oropharynx is without other ulcerations and moist  Neck: Normal range of motion. Neck supple. No JVD present. No tracheal deviation present or significant neck LA or mass Cardiovascular: Normal rate, regular rhythm, normal heart sounds and intact distal pulses.   Pulmonary/Chest: WOB normal and breath sounds without rales or wheezing  Abdominal: Soft. Bowel sounds are normal. NT. No HSM  Musculoskeletal: Normal range of motion. Exhibits no edema Lymphadenopathy: Has no other cervical adenopathy.  Neurological: Pt is alert and oriented to person, place, and time. Pt has normal reflexes. No cranial nerve deficit. Motor grossly intact, Gait intact Skin: Skin is warm and dry. No rash noted or new ulcerations Psychiatric:  Has nervous mood and affect. Behavior is normal without agitation No other exam findings    Assessment & Plan:

## 2017-05-15 NOTE — Patient Instructions (Signed)
Please continue all other medications as before, and refills have been done if requested.  Please have the pharmacy call with any other refills you may need.  Please continue your efforts at being more active, low cholesterol diet, and weight control.  You are otherwise up to date with prevention measures today.  Please keep your appointments with your specialists as you may have planned  Your work note was done and faxed today  Please go to the LAB in the Basement (turn left off the elevator) for the tests to be done today  You will be contacted by phone if any changes need to be made immediately.  Otherwise, you will receive a letter about your results with an explanation, but please check with MyChart first.  Please remember to sign up for MyChart if you have not done so, as this will be important to you in the future with finding out test results, communicating by private email, and scheduling acute appointments online when needed.  Please return in 1 year for your yearly visit, or sooner if needed, with Lab testing done 3-5 days before

## 2017-05-16 ENCOUNTER — Encounter: Payer: Self-pay | Admitting: Internal Medicine

## 2017-05-16 LAB — HIV ANTIBODY (ROUTINE TESTING W REFLEX): HIV: NONREACTIVE

## 2017-05-16 NOTE — Assessment & Plan Note (Signed)
Lab Results  Component Value Date   HGBA1C 5.4 06/26/2015  stable overall by history and exam, recent data reviewed with pt, and pt to continue medical treatment as before,  to f/u any worsening symptoms or concerns

## 2017-05-16 NOTE — Assessment & Plan Note (Signed)
For TFT's

## 2017-05-16 NOTE — Assessment & Plan Note (Signed)
For work note

## 2017-05-16 NOTE — Assessment & Plan Note (Signed)

## 2017-05-17 ENCOUNTER — Other Ambulatory Visit: Payer: Self-pay | Admitting: Internal Medicine

## 2017-05-17 DIAGNOSIS — E039 Hypothyroidism, unspecified: Secondary | ICD-10-CM

## 2017-05-17 MED ORDER — LEVOTHYROXINE SODIUM 150 MCG PO TABS: 150.0000 ug | ORAL_TABLET | Freq: Every day | ORAL | 3 refills | Status: DC

## 2017-05-18 ENCOUNTER — Telehealth: Payer: Self-pay

## 2017-05-18 NOTE — Telephone Encounter (Signed)
Pt has been informed and expressed understanding.  

## 2017-05-18 NOTE — Telephone Encounter (Signed)
-----   Message from Corwin LevinsJames W John, MD sent at 05/17/2017  1:51 PM EST ----- Left message on MyChart, pt to cont same tx except  The test results show that your current treatment is OK, except the TSH is now high, meaning you have low thyroid condition and may be related to recent wt gain; we will need to increase the levothyroxine from 125 to 150 mcg per day.  Please return to the LAB only in 4 weeks (no office visit needed) to recheck.  I will send the prescription, and you should hear from the office as well.      Sunni Richardson to please inform pt, I will do rx and lab order

## 2017-05-21 ENCOUNTER — Other Ambulatory Visit: Payer: Self-pay | Admitting: Internal Medicine

## 2017-05-21 DIAGNOSIS — E038 Other specified hypothyroidism: Secondary | ICD-10-CM

## 2017-06-17 ENCOUNTER — Encounter: Payer: Self-pay | Admitting: Nurse Practitioner

## 2017-06-17 ENCOUNTER — Ambulatory Visit: Payer: BLUE CROSS/BLUE SHIELD | Admitting: Nurse Practitioner

## 2017-06-17 VITALS — BP 112/70 | HR 92 | Temp 99.2°F | Resp 16 | Ht 65.0 in | Wt 269.0 lb

## 2017-06-17 DIAGNOSIS — R509 Fever, unspecified: Secondary | ICD-10-CM | POA: Diagnosis not present

## 2017-06-17 DIAGNOSIS — J101 Influenza due to other identified influenza virus with other respiratory manifestations: Secondary | ICD-10-CM | POA: Diagnosis not present

## 2017-06-17 LAB — POC INFLUENZA A&B (BINAX/QUICKVUE)
INFLUENZA B, POC: NEGATIVE
Influenza A, POC: POSITIVE — AB

## 2017-06-17 MED ORDER — OSELTAMIVIR PHOSPHATE 75 MG PO CAPS: 75.0000 mg | ORAL_CAPSULE | Freq: Every day | ORAL | 0 refills | Status: DC

## 2017-06-17 MED ORDER — OSELTAMIVIR PHOSPHATE 75 MG PO CAPS: 75.0000 mg | ORAL_CAPSULE | Freq: Two times a day (BID) | ORAL | 0 refills | Status: DC

## 2017-06-17 NOTE — Patient Instructions (Addendum)
I have sent a prescription for tamiflu 75 mg twice daily for 5 days for the flu.  For your cough, you may try delsym over the counter. Honey for sore throat. Saline nasal spray used frequently. For drainage may use Allegra, Claritin or Zyrtec. If you need stronger medicine to stop drainage may take Chlor-Trimeton 2-4 mg every 4 hours. This may cause drowsiness. Ibuprofen 600 mg every 6 hours or tylenol 1000 mg three times a day as needed for pain, discomfort or fever. Drink plenty of fluids and stay well-hydrated.  It was good to meet you. Thanks for letting me take care of you today :)   Influenza, Adult Influenza, more commonly known as "the flu," is a viral infection that primarily affects the respiratory tract. The respiratory tract includes organs that help you breathe, such as the lungs, nose, and throat. The flu causes many common cold symptoms, as well as a high fever and body aches. The flu spreads easily from person to person (is contagious). Getting a flu shot (influenza vaccination) every year is the best way to prevent influenza. What are the causes? Influenza is caused by a virus. You can catch the virus by:  Breathing in droplets from an infected person's cough or sneeze.  Touching something that was recently contaminated with the virus and then touching your mouth, nose, or eyes.  What increases the risk? The following factors may make you more likely to get the flu:  Not cleaning your hands frequently with soap and water or alcohol-based hand sanitizer.  Having close contact with many people during cold and flu season.  Touching your mouth, eyes, or nose without washing or sanitizing your hands first.  Not drinking enough fluids or not eating a healthy diet.  Not getting enough sleep or exercise.  Being under a high amount of stress.  Not getting a yearly (annual) flu shot.  You may be at a higher risk of complications from the flu, such as a severe lung  infection (pneumonia), if you:  Are over the age of 28.  Are pregnant.  Have a weakened disease-fighting system (immune system). You may have a weakened immune system if you: ? Have HIV or AIDS. ? Are undergoing chemotherapy. ? Aretaking medicines that reduce the activity of (suppress) the immune system.  Have a long-term (chronic) illness, such as heart disease, kidney disease, diabetes, or lung disease.  Have a liver disorder.  Are obese.  Have anemia.  What are the signs or symptoms? Symptoms of this condition typically last 4-10 days and may include:  Fever.  Chills.  Headache, body aches, or muscle aches.  Sore throat.  Cough.  Runny or congested nose.  Chest discomfort and cough.  Poor appetite.  Weakness or tiredness (fatigue).  Dizziness.  Nausea or vomiting.  How is this diagnosed? This condition may be diagnosed based on your medical history and a physical exam. Your health care provider may do a nose or throat swab test to confirm the diagnosis. How is this treated? If influenza is detected early, you can be treated with antiviral medicine that can reduce the length of your illness and the severity of your symptoms. This medicine may be given by mouth (orally) or through an IV tube that is inserted in one of your veins. The goal of treatment is to relieve symptoms by taking care of yourself at home. This may include taking over-the-counter medicines, drinking plenty of fluids, and adding humidity to the air in your home.  In some cases, influenza goes away on its own. Severe influenza or complications from influenza may be treated in a hospital. Follow these instructions at home:  Take over-the-counter and prescription medicines only as told by your health care provider.  Use a cool mist humidifier to add humidity to the air in your home. This can make breathing easier.  Rest as needed.  Drink enough fluid to keep your urine clear or pale  yellow.  Cover your mouth and nose when you cough or sneeze.  Wash your hands with soap and water often, especially after you cough or sneeze. If soap and water are not available, use hand sanitizer.  Stay home from work or school as told by your health care provider. Unless you are visiting your health care provider, try to avoid leaving home until your fever has been gone for 24 hours without the use of medicine.  Keep all follow-up visits as told by your health care provider. This is important. How is this prevented?  Getting an annual flu shot is the best way to avoid getting the flu. You may get the flu shot in late summer, fall, or winter. Ask your health care provider when you should get your flu shot.  Wash your hands often or use hand sanitizer often.  Avoid contact with people who are sick during cold and flu season.  Eat a healthy diet, drink plenty of fluids, get enough sleep, and exercise regularly. Contact a health care provider if:  You develop new symptoms.  You have: ? Chest pain. ? Diarrhea. ? A fever.  Your cough gets worse.  You produce more mucus.  You feel nauseous or you vomit. Get help right away if:  You develop shortness of breath or difficulty breathing.  Your skin or nails turn a bluish color.  You have severe pain or stiffness in your neck.  You develop a sudden headache or sudden pain in your face or ear.  You cannot stop vomiting. This information is not intended to replace advice given to you by your health care provider. Make sure you discuss any questions you have with your health care provider. Document Released: 04/18/2000 Document Revised: 09/27/2015 Document Reviewed: 02/13/2015 Elsevier Interactive Patient Education  2017 [employer].

## 2017-06-17 NOTE — Progress Notes (Signed)
Name: Brian Fowler   MRN: [id number]    DOB: 13-Oct-1990   Date:06/17/2017       Progress Note  Subjective  Chief Complaint  Chief Complaint  Patient presents with  . Fever    noticed a couple nights ago he had a fever of 103 fever has come down to 100, body aches, cough    HPI Brian Fowler is coming in today for an acute complaint of cough/cold.  His symptoms began about 2 days ago. He began to have body aches and cough on the first day then yesterday noticed a high fever of 101. His cough has improved but he c/o malaise and just not feeling well. He has been taking tylenol at home with reduction of fevers. He denies confusion, syncope, chest pain, shortness of breath, abdominal pain, nasuea, vomiting. 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 Low back strain 08/03/2015  . Insomnia 07/06/2013  . Routine general medical examination at a health care facility 07/06/2013  . Other abnormal glucose 07/06/2013  . Hypothyroidism 10/09/2012  . Graves disease 06/28/2012  . Anxiety - followed by Dr. Antony MaduraLafaye 06/03/2012    Social History   Tobacco Use  . Smoking status: Never Smoker  . Smokeless tobacco: Never Used  Substance Use Topics  . Alcohol use: Yes    Alcohol/week: 0.5 oz    Types: 1 Standard drinks or equivalent per week     Current Outpatient Medications:  .  levothyroxine (SYNTHROID, LEVOTHROID) 150 MCG tablet, Take 1 tablet (150 mcg total) by mouth daily., Disp: 90 tablet, Rfl: 3  Allergies  Allergen Reactions  . Penicillins Anaphylaxis    Swelling, hives, SOB    ROS See HPI  Objective  Vitals:   06/17/17 0957  BP: 112/70  Pulse: 92  Resp: 16  Temp: 99.2 F (37.3 C)  TempSrc: Oral  SpO2: 94%  Weight: 269 lb (122 kg)  Height: 5\' 5"  (1.651 m)   Body mass index is 44.76 kg/m.  Nursing Note and Vital Signs  reviewed.  Physical Exam Vital signs reviewed. Constitutional: Patient appears well-developed and well-nourished. Obese No distress.  HEENT: head atraumatic, normocephalic, pupils equal and reactive to light, EOM's intact, no maxillary or frontal sinus tenderness , neck supple, oropharynx pink and moist without exudate Cardiovascular: Normal rate, regular rhythm, S1/S2 present.  No murmur or rub heard. No BLE edema. Pulmonary/Chest: Effort normal and breath sounds clear. No respiratory distress or retractions. Abdominal: Soft and non-tender, bowel sounds present x4 quadrants.  Psychiatric: Patient has a normal mood and affect. behavior is normal. Judgment and thought content normal.  Assessment & Plan   1. Fever, unspecified fever cause - POC Influenza A&B (Binax test)-Positive for influenza A  2. Influenza A Symptoms within 48 hours, will start tamiflu. Discussed symptom management at home and return precautions- See AVS for education provided to patient - oseltamivir (TAMIFLU) 75 MG capsule; Take 1 capsule (75 mg total) by mouth 2 (two) times daily.  Dispense: 10 capsule; Refill: 0

## 2017-12-22 ENCOUNTER — Telehealth: Payer: Self-pay | Admitting: Internal Medicine

## 2017-12-22 NOTE — Telephone Encounter (Signed)
Call patient and LVM - Want to make sure he is talking about the letter from 05/2017.  I have faxed this letter, but if something else is needed please let us know.

## 2017-12-22 NOTE — Telephone Encounter (Signed)
Copied from CRM [id number]. Topic: Quick Communication - See Telephone Encounter >> Dec 22, 2017  9:46 AM Raquel SarnaHayes, Teresa G wrote: Pt needing his work restrictions faxed to his work ASAP -  [phone]

## 2017-12-24 NOTE — Telephone Encounter (Signed)
Patient came by the office and dropped off a reasonable accommodation heath care provider form to be complete for his work restrictions.  Form has been completed & placed in providers box to review and sign.

## 2018-03-06 ENCOUNTER — Other Ambulatory Visit: Payer: Self-pay | Admitting: Internal Medicine

## 2018-03-06 DIAGNOSIS — Z23 Encounter for immunization: Secondary | ICD-10-CM | POA: Diagnosis not present

## 2018-03-07 ENCOUNTER — Emergency Department (HOSPITAL_COMMUNITY): Payer: BLUE CROSS/BLUE SHIELD

## 2018-03-07 ENCOUNTER — Emergency Department (HOSPITAL_COMMUNITY)
Admission: EM | Admit: 2018-03-07 | Discharge: 2018-03-07 | Disposition: A | Discharge status: Home / Self Care | Payer: BLUE CROSS/BLUE SHIELD | Attending: Emergency Medicine | Admitting: Emergency Medicine

## 2018-03-07 ENCOUNTER — Encounter (HOSPITAL_COMMUNITY): Payer: Self-pay | Admitting: Emergency Medicine

## 2018-03-07 DIAGNOSIS — Z79899 Other long term (current) drug therapy: Secondary | ICD-10-CM | POA: Insufficient documentation

## 2018-03-07 DIAGNOSIS — M545 Low back pain, unspecified: Secondary | ICD-10-CM

## 2018-03-07 DIAGNOSIS — E039 Hypothyroidism, unspecified: Secondary | ICD-10-CM | POA: Insufficient documentation

## 2018-03-07 DIAGNOSIS — S3992XA Unspecified injury of lower back, initial encounter: Secondary | ICD-10-CM | POA: Diagnosis not present

## 2018-03-07 DIAGNOSIS — S299XXA Unspecified injury of thorax, initial encounter: Secondary | ICD-10-CM | POA: Diagnosis not present

## 2018-03-07 MED ORDER — METHYLPREDNISOLONE 4 MG PO TBPK: ORAL_TABLET | ORAL | 0 refills | Status: DC

## 2018-03-07 MED ORDER — OXYCODONE-ACETAMINOPHEN 5-325 MG PO TABS: 1.0000 | ORAL_TABLET | ORAL | 0 refills | Status: AC | PRN

## 2018-03-07 MED ORDER — HYDROMORPHONE HCL 1 MG/ML IJ SOLN
0.5000 mg | Freq: Once | INTRAMUSCULAR | Status: AC
  Administered 2018-03-07: 0.5 mg via INTRAVENOUS
  Filled 2018-03-07: qty 1

## 2018-03-07 MED ORDER — SODIUM CHLORIDE 0.9 % IV BOLUS
1000.0000 mL | Freq: Once | INTRAVENOUS | Status: AC
  Administered 2018-03-07: 1000 mL via INTRAVENOUS

## 2018-03-07 NOTE — Discharge Instructions (Addendum)
I have prescribed steroids, he is to be advised this medication can cause insomnia, appetite changes.  Please follow up with Dr. Maurice Small of neurosurgery this week. I have also provided medication to help with you pain.If  uou experience any bowel or bladder incontinence, fever, worsening in your symptoms please return to the ED.

## 2018-03-07 NOTE — ED Notes (Signed)
Patient transported to MRI 
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
## 2018-03-07 NOTE — ED Provider Notes (Signed)
[facility] EMERGENCY DEPARTMENT Provider Note   CSN: [id number] Arrival date & time: 03/07/18  1024     History   Chief Complaint Chief Complaint  Patient presents with  . Back Injury    HPI Brian Fowler is a 27 y.o. male.  27 y/o male with a PMH of Anxiety presents to the ED with a chief complaint of back pain and uncontrolled falls x 2 days. Patient was at [employer] parking lot opening the car door for his girlfriend when the vehicle next door swung the door open hitting him on the lower lumbar region. He describes this pain as sharp with radiation to his lower legs Friday.He has taken aleve without relieve in pain. He was adjusted by his chiropractor yesterday and states he told him his "muscles were tensing". Patient reports 3 uncontrolled falls, he states the pain on his back is so severe that makes him fall forward as his legs give out. He did not seek medical attention Friday but was seen by [facility] this morning and referred to the ED. He denies any urinary retention, bowel incontinence, fever, abdominal pain or chest pain.      Past Medical History:  Diagnosis Date  . Anxiety   . Thyroid disease    hypothyroidism     Patient Active Problem List   Diagnosis Date Noted  . Low back strain 08/03/2015  . Insomnia 07/06/2013  . Routine general medical examination at a health care facility 07/06/2013  . Other abnormal glucose 07/06/2013  . Hypothyroidism 10/09/2012  . Graves disease 06/28/2012  . Anxiety - followed by Dr. Antony Madura 06/03/2012    Past Surgical History:  Procedure Laterality Date  . COSMETIC SURGERY  11/2011   for gynecomastia  . TONSILLECTOMY AND ADENOIDECTOMY          Home Medications    Prior to Admission medications   Medication Sig Start Date End Date Taking? Authorizing Provider  levothyroxine (SYNTHROID, LEVOTHROID) 150 MCG tablet Take 1 tablet (150 mcg total) by mouth daily. Patient taking differently:  Take 125 mcg by mouth daily.  05/17/17  Yes Corwin Levins, MD  naproxen sodium (ALEVE) 220 MG tablet Take 440 mg by mouth as needed.   Yes [provider]  methylPREDNISolone (MEDROL DOSEPAK) 4 MG TBPK tablet Take 6 tablets on Day 1  Take 5 tablets on Day 2 Take 4 tablets on Day 3 Take 3 tablets on Day 4 Take 2 tablets on Day 5 Take 1 tablet on Day 6 03/07/18   Claude Manges, PA-C  oseltamivir (TAMIFLU) 75 MG capsule Take 1 capsule (75 mg total) by mouth 2 (two) times daily. Patient not taking: Reported on 03/07/2018 06/17/17   Evaristo Bury, NP  oxyCODONE-acetaminophen (PERCOCET/ROXICET) 5-325 MG tablet Take 1 tablet by mouth every 4 (four) hours as needed for up to 3 days for severe pain. 03/07/18 03/10/18  Claude Manges, PA-C    Family History Family History  Problem Relation Age of Onset  . Breast cancer Mother   . Cancer Mother        breast cancer  . Hypertension Father   . Arthritis Unknown        grandparent  . Prostate cancer Unknown        grandparent  . Hyperlipidemia Unknown        grandparent  . Hypertension Unknown        grandparent/parent  . Kidney disease Unknown  grandparent   . Diabetes Paternal Grandfather   . Early death Neg Hx   . Stroke Neg Hx     Social History Social History   Tobacco Use  . Smoking status: Never Smoker  . Smokeless tobacco: Never Used  Substance Use Topics  . Alcohol use: Yes    Alcohol/week: 1.0 standard drinks    Types: 1 Standard drinks or equivalent per week  . Drug use: No     Allergies   Penicillins   Review of Systems Review of Systems  Constitutional: Negative for chills and fever.  HENT: Negative for sore throat.   Respiratory: Negative for shortness of breath.   Cardiovascular: Negative for chest pain.  Gastrointestinal: Negative for abdominal pain, diarrhea and nausea.  Genitourinary: Negative for dysuria, flank pain and hematuria.  Musculoskeletal: Positive for back pain, gait problem and  myalgias.  Skin: Negative for pallor and wound.  Neurological: Negative for syncope, facial asymmetry, light-headedness and headaches.     Physical Exam Updated Vital Signs BP 103/62   Pulse 72   Temp 98.4 F (36.9 C)   Resp 18   SpO2 96%   Physical Exam  Constitutional: He is oriented to person, place, and time. He appears well-developed and well-nourished.  HENT:  Head: Normocephalic and atraumatic.  Neck: Normal range of motion. Neck supple.  Cardiovascular: Normal heart sounds.  Pulmonary/Chest: Breath sounds normal.  Abdominal: Soft. Bowel sounds are normal. There is no tenderness. There is no rigidity, no guarding and no CVA tenderness.  Musculoskeletal: He exhibits tenderness.       Lumbar back: He exhibits tenderness, bony tenderness and spasm.       Back:  RLE- KF,KE 5/5 strength LLE- HF, HE 5/5 strength Normal gait. No pronator drift. No leg drop.  Patellar reflexes present and symmetric.    Neurological: He is alert and oriented to person, place, and time.  Skin: Skin is warm and dry.  Nursing note and vitals reviewed.    ED Treatments / Results  Labs (all labs ordered are listed, but only abnormal results are displayed) Labs Reviewed - No data to display  EKG None  Radiology Mr Thoracic Spine Wo Contrast  Result Date: 03/07/2018 CLINICAL DATA:  Hit by car door 2 days ago. Intermittent numbness and loss of control lower extremity since then. Unable to stand. EXAM: MRI THORACIC AND LUMBAR SPINE WITHOUT CONTRAST TECHNIQUE: Multiplanar and multiecho pulse sequences of the thoracic and lumbar spine were obtained without intravenous contrast. COMPARISON:  None. FINDINGS: MRI THORACIC SPINE FINDINGS Alignment:  AP alignment is anatomic. Vertebrae: Marrow signal and vertebral body heights are normal. Schmorl's nodes are evident at T9-10, T10-11, T11-12, and T12-L1. Cord:  Normal signal is present throughout the thoracic spinal cord. Paraspinal and other soft  tissues: Paraspinous soft tissues are unremarkable. Limited imaging the lungs is within normal limits. Disc levels: Asymmetric right-sided facet hypertrophy is present at T4-5 with some encroachment of posterior canal but no significant stenosis. Foramina are patent in the upper thoracic spine. T10-11: Lateral disc bulging and mild facet hypertrophy contribute to mild foraminal narrowing bilaterally. The central canal is patent. T11-12: Negative. T12-L1: Mild leftward disc bulging is present without significant stenosis. MRI LUMBAR SPINE FINDINGS Segmentation: 5 non rib-bearing lumbar type vertebral bodies are present. The lowest fully formed vertebral body is L5. Alignment:  AP alignment is anatomic. Vertebrae: Edematous posterior endplate marrow changes are present at L2-3. More chronic posterior endplate changes are present at L1-2. Schmorl's nodes are  present at L2-3 and L3-4. Marrow signal and vertebral body heights are otherwise normal. Conus medullaris and cauda equina: Conus extends to the L1-2 level. Conus and cauda equina appear normal. Paraspinal and other soft tissues: Limited imaging of the abdomen is unremarkable. No solid organ lesions are present. There is no significant adenopathy. Disc levels: L1-2: A mild broad-based disc protrusion is present. No significant compressive stenosis is present. L2-3: A left paramedian disc protrusion is present. This results in moderate left central canal stenosis. The foramina are patent. This may be an acute disc rupture. L3-4: Mild disc bulging is present without significant stenosis. L4-5: Negative. L5-S1: Negative. IMPRESSION: 1. Left paramedian disc protrusion with edematous endplate changes. This may be an acute or subacute disc rupture. There is moderate left central canal stenosis. The foramina are patent. 2. More chronic broad-based disc protrusion at L1-2 without significant stenosis. 3. Mild disc bulging at L3-4 without significant stenosis. 4. Mild  foraminal narrowing bilaterally at T10-11 secondary to lateral disc bulging and mild bilateral facet hypertrophy. 5. Asymmetric right-sided facet hypertrophy at T4-5 without significant stenosis. 6. No other focal stenosis in the thoracic spine. Electronically Signed   By: Marin Roberts M.D.   On: 03/07/2018 14:34   Mr Lumbar Spine Wo Contrast  Result Date: 03/07/2018 CLINICAL DATA:  Hit by car door 2 days ago. Intermittent numbness and loss of control lower extremity since then. Unable to stand. EXAM: MRI THORACIC AND LUMBAR SPINE WITHOUT CONTRAST TECHNIQUE: Multiplanar and multiecho pulse sequences of the thoracic and lumbar spine were obtained without intravenous contrast. COMPARISON:  None. FINDINGS: MRI THORACIC SPINE FINDINGS Alignment:  AP alignment is anatomic. Vertebrae: Marrow signal and vertebral body heights are normal. Schmorl's nodes are evident at T9-10, T10-11, T11-12, and T12-L1. Cord:  Normal signal is present throughout the thoracic spinal cord. Paraspinal and other soft tissues: Paraspinous soft tissues are unremarkable. Limited imaging the lungs is within normal limits. Disc levels: Asymmetric right-sided facet hypertrophy is present at T4-5 with some encroachment of posterior canal but no significant stenosis. Foramina are patent in the upper thoracic spine. T10-11: Lateral disc bulging and mild facet hypertrophy contribute to mild foraminal narrowing bilaterally. The central canal is patent. T11-12: Negative. T12-L1: Mild leftward disc bulging is present without significant stenosis. MRI LUMBAR SPINE FINDINGS Segmentation: 5 non rib-bearing lumbar type vertebral bodies are present. The lowest fully formed vertebral body is L5. Alignment:  AP alignment is anatomic. Vertebrae: Edematous posterior endplate marrow changes are present at L2-3. More chronic posterior endplate changes are present at L1-2. Schmorl's nodes are present at L2-3 and L3-4. Marrow signal and vertebral body  heights are otherwise normal. Conus medullaris and cauda equina: Conus extends to the L1-2 level. Conus and cauda equina appear normal. Paraspinal and other soft tissues: Limited imaging of the abdomen is unremarkable. No solid organ lesions are present. There is no significant adenopathy. Disc levels: L1-2: A mild broad-based disc protrusion is present. No significant compressive stenosis is present. L2-3: A left paramedian disc protrusion is present. This results in moderate left central canal stenosis. The foramina are patent. This may be an acute disc rupture. L3-4: Mild disc bulging is present without significant stenosis. L4-5: Negative. L5-S1: Negative. IMPRESSION: 1. Left paramedian disc protrusion with edematous endplate changes. This may be an acute or subacute disc rupture. There is moderate left central canal stenosis. The foramina are patent. 2. More chronic broad-based disc protrusion at L1-2 without significant stenosis. 3. Mild disc bulging at L3-4 without  significant stenosis. 4. Mild foraminal narrowing bilaterally at T10-11 secondary to lateral disc bulging and mild bilateral facet hypertrophy. 5. Asymmetric right-sided facet hypertrophy at T4-5 without significant stenosis. 6. No other focal stenosis in the thoracic spine. Electronically Signed   By: Marin Roberts M.D.   On: 03/07/2018 14:34    Procedures Procedures (including critical care time)  Medications Ordered in ED Medications  sodium chloride 0.9 % bolus 1,000 mL (0 mLs Intravenous Stopped 03/07/18 1258)  HYDROmorphone (DILAUDID) injection 0.5 mg (0.5 mg Intravenous Given 03/07/18 1154)     Initial Impression / Assessment and Plan / ED Course  I have reviewed the triage vital signs and the nursing notes.  Pertinent labs & imaging results that were available during my care of the patient were reviewed by me and considered in my medical decision making (see chart for details).    Patient present with multiple falls  at home after being [place] door [place] [employer]. Upon examination patient state he has weaker feeling in his right leg versus left legs. However, during my exam patient has 5/5 strength bilaterally. Patient had adjustment by chiropractor yesterday, and has been taking aleve with no relieve.No bowel or bladder complaints, fever, iv drug use.  MRI thoracic and lumbar ordered to r/o any acute abnormalities.  MRI thoracic and Lumbar showed : 1. Left paramedian disc protrusion with edematous endplate changes. This may be an acute or subacute disc rupture. There is moderate left central canal stenosis. The foramina are patent. 2. More chronic broad-based disc protrusion at L1-2 without significant stenosis. 3. Mild disc bulging at L3-4 without significant stenosis. 4. Mild foraminal narrowing bilaterally at T10-11 secondary to lateral disc bulging and mild bilateral facet hypertrophy. 5. Asymmetric right-sided facet hypertrophy at T4-5 without significant stenosis. 6. No other focal stenosis in the thoracic spine.  Will place call out to neurosurgery for their recommendations.   3:07 PM Spoke to neurosurgery who will see patient on outpatient basis, will discharge him on steroid pack.  I have discussed with patient the results of his MRI, father is at the bedside will have him follow up outpatient.They understand and agree with plan. Return precautions provided.   Final Clinical Impressions(s) / ED Diagnoses   Final diagnoses:  Acute midline low back pain, unspecified whether sciatica present    ED Discharge Orders         Ordered    methylPREDNISolone (MEDROL DOSEPAK) 4 MG TBPK tablet  Status:  Discontinued     03/07/18 1514    methylPREDNISolone (MEDROL DOSEPAK) 4 MG TBPK tablet     03/07/18 1521    oxyCODONE-acetaminophen (PERCOCET/ROXICET) 5-325 MG tablet  Every 4 hours PRN     03/07/18 1521           Claude Manges, PA-C 03/07/18 1524    Benjiman Core,  MD 03/07/18 1555

## 2018-03-10 DIAGNOSIS — Z6841 Body Mass Index (BMI) 40.0 and over, adult: Secondary | ICD-10-CM | POA: Diagnosis not present

## 2018-03-10 DIAGNOSIS — M5416 Radiculopathy, lumbar region: Secondary | ICD-10-CM | POA: Diagnosis not present

## 2018-03-16 ENCOUNTER — Encounter (HOSPITAL_COMMUNITY): Payer: Self-pay

## 2018-03-16 ENCOUNTER — Emergency Department (HOSPITAL_COMMUNITY)
Admission: EM | Admit: 2018-03-16 | Discharge: 2018-03-16 | Disposition: A | Discharge status: Home / Self Care | Payer: BLUE CROSS/BLUE SHIELD | Attending: Emergency Medicine | Admitting: Emergency Medicine

## 2018-03-16 ENCOUNTER — Other Ambulatory Visit: Payer: Self-pay

## 2018-03-16 DIAGNOSIS — Z79899 Other long term (current) drug therapy: Secondary | ICD-10-CM | POA: Insufficient documentation

## 2018-03-16 DIAGNOSIS — M545 Low back pain: Secondary | ICD-10-CM | POA: Diagnosis not present

## 2018-03-16 DIAGNOSIS — E039 Hypothyroidism, unspecified: Secondary | ICD-10-CM | POA: Diagnosis not present

## 2018-03-16 DIAGNOSIS — M5441 Lumbago with sciatica, right side: Secondary | ICD-10-CM

## 2018-03-16 MED ORDER — METHOCARBAMOL 500 MG PO TABS: 500.0000 mg | ORAL_TABLET | Freq: Three times a day (TID) | ORAL | 0 refills | Status: DC | PRN

## 2018-03-16 MED ORDER — PREDNISONE 20 MG PO TABS
60.0000 mg | ORAL_TABLET | Freq: Once | ORAL | Status: AC
  Administered 2018-03-16: 60 mg via ORAL
  Filled 2018-03-16: qty 3

## 2018-03-16 MED ORDER — HYDROMORPHONE HCL 1 MG/ML IJ SOLN
1.0000 mg | Freq: Once | INTRAMUSCULAR | Status: AC
  Administered 2018-03-16: 1 mg via INTRAMUSCULAR
  Filled 2018-03-16: qty 1

## 2018-03-16 MED ORDER — KETOROLAC TROMETHAMINE 60 MG/2ML IM SOLN
60.0000 mg | Freq: Once | INTRAMUSCULAR | Status: AC
  Administered 2018-03-16: 60 mg via INTRAMUSCULAR
  Filled 2018-03-16: qty 2

## 2018-03-16 MED ORDER — PREDNISONE 20 MG PO TABS: 40.0000 mg | ORAL_TABLET | Freq: Every day | ORAL | 0 refills | Status: AC

## 2018-03-16 MED ORDER — LIDOCAINE 5 % EX PTCH: 1.0000 | MEDICATED_PATCH | CUTANEOUS | 0 refills | Status: DC

## 2018-03-16 MED ORDER — DIAZEPAM 5 MG PO TABS
5.0000 mg | ORAL_TABLET | Freq: Once | ORAL | Status: AC
  Administered 2018-03-16: 5 mg via ORAL
  Filled 2018-03-16: qty 1

## 2018-03-16 MED ORDER — OXYCODONE-ACETAMINOPHEN 5-325 MG PO TABS
1.0000 | ORAL_TABLET | Freq: Once | ORAL | Status: AC
  Administered 2018-03-16: 1 via ORAL
  Filled 2018-03-16: qty 1

## 2018-03-16 MED ORDER — OXYCODONE-ACETAMINOPHEN 5-325 MG PO TABS: 1.0000 | ORAL_TABLET | ORAL | 0 refills | Status: DC | PRN

## 2018-03-16 NOTE — ED Provider Notes (Signed)
[facility] EMERGENCY DEPARTMENT Provider Note   CSN: [id number] Arrival date & time: 03/16/18  1512     History   Chief Complaint Chief Complaint  Patient presents with  . Back Pain    HPI Brian Fowler is a 27 y.o. male.  HPI Patient is a 27 year old male presents emergency department exacerbation of his right low back pain.  He is recently seen and evaluated by a neurosurgeon and by this emergency department with an MRI of his low back demonstrating possible acute to subacute L2-3 disc rupture.  He states he lifted a box 3 days ago and exacerbated his low back.  He tried 1 of his mom's oxycodone and Aleve today without improvement in symptoms.  He spoke with his neurosurgeon who recommended he come to the ER for pain control.  He reports right low back pain with shooting burning pain down his right leg.  No new weakness of his right leg.  No fevers or chills.  No other complaints.  Pain is severe in severity.  No bowel or bladder complaints     Past Medical History:  Diagnosis Date  . Anxiety   . Thyroid disease    hypothyroidism     Patient Active Problem List   Diagnosis Date Noted  . Low back strain 08/03/2015  . Insomnia 07/06/2013  . Routine general medical examination at a health care facility 07/06/2013  . Other abnormal glucose 07/06/2013  . Hypothyroidism 10/09/2012  . Graves disease 06/28/2012  . Anxiety - followed by Dr. Antony Madura 06/03/2012    Past Surgical History:  Procedure Laterality Date  . COSMETIC SURGERY  11/2011   for gynecomastia  . TONSILLECTOMY AND ADENOIDECTOMY          Home Medications    Prior to Admission medications   Medication Sig Start Date End Date Taking? Authorizing Provider  levothyroxine (SYNTHROID, LEVOTHROID) 150 MCG tablet TAKE 1 TABLET BY MOUTH EVERY DAY 03/08/18   Corwin Levins, MD  methylPREDNISolone (MEDROL DOSEPAK) 4 MG TBPK tablet Take 6 tablets on Day 1  Take 5 tablets on Day 2 Take 4 tablets  on Day 3 Take 3 tablets on Day 4 Take 2 tablets on Day 5 Take 1 tablet on Day 6 03/07/18   Claude Manges, PA-C  naproxen sodium (ALEVE) 220 MG tablet Take 440 mg by mouth as needed.    [provider]  oseltamivir (TAMIFLU) 75 MG capsule Take 1 capsule (75 mg total) by mouth 2 (two) times daily. Patient not taking: Reported on 03/07/2018 06/17/17   Evaristo Bury, NP    Family History Family History  Problem Relation Age of Onset  . Breast cancer Mother   . Cancer Mother        breast cancer  . Hypertension Father   . Arthritis Unknown        grandparent  . Prostate cancer Unknown        grandparent  . Hyperlipidemia Unknown        grandparent  . Hypertension Unknown        grandparent/parent  . Kidney disease Unknown        grandparent   . Diabetes Paternal Grandfather   . Early death Neg Hx   . Stroke Neg Hx     Social History Social History   Tobacco Use  . Smoking status: Never Smoker  . Smokeless tobacco: Never Used  Substance Use Topics  . Alcohol use: Yes    Alcohol/week:  1.0 standard drinks    Types: 1 Standard drinks or equivalent per week  . Drug use: No     Allergies   Penicillins   Review of Systems Review of Systems  All other systems reviewed and are negative.    Physical Exam Updated Vital Signs BP (!) 92/54 (BP Location: Right Arm)   Pulse (!) 59   Temp (!) 97.4 F (36.3 C) (Oral)   Resp 20   Ht 5\' 5"  (1.651 m)   Wt 117.9 kg   SpO2 98%   BMI 43.27 kg/m   Physical Exam  Constitutional: He is oriented to person, place, and time. He appears well-developed and well-nourished.  HENT:  Head: Normocephalic.  Eyes: EOM are normal.  Neck: Normal range of motion.  Pulmonary/Chest: Effort normal.  Abdominal: He exhibits no distension.  Musculoskeletal: Normal range of motion.  No thoracic or lumbar midline tenderness.  Mild paralumbar tenderness on the right.  Normal plantar flexion of the right foot.  Neurological: He is  alert and oriented to person, place, and time.  Psychiatric: He has a normal mood and affect.  Nursing note and vitals reviewed.    ED Treatments / Results  Labs (all labs ordered are listed, but only abnormal results are displayed) Labs Reviewed - No data to display  EKG None  Radiology No results found.   MRI thoracic and lumbar spine (March 07, 2018) IMPRESSION: 1. Left paramedian disc protrusion with edematous endplate changes. This may be an acute or subacute disc rupture. There is moderate left central canal stenosis. The foramina are patent. 2. More chronic broad-based disc protrusion at L1-2 without significant stenosis. 3. Mild disc bulging at L3-4 without significant stenosis. 4. Mild foraminal narrowing bilaterally at T10-11 secondary to lateral disc bulging and mild bilateral facet hypertrophy. 5. Asymmetric right-sided facet hypertrophy at T4-5 without significant stenosis. 6. No other focal stenosis in the thoracic spine.  Procedures Procedures (including critical care time)  Medications Ordered in ED Medications  diazepam (VALIUM) tablet 5 mg (has no administration in time range)  oxyCODONE-acetaminophen (PERCOCET/ROXICET) 5-325 MG per tablet 1 tablet (has no administration in time range)  ketorolac (TORADOL) injection 60 mg (has no administration in time range)  predniSONE (DELTASONE) tablet 60 mg (has no administration in time range)     Initial Impression / Assessment and Plan / ED Course  I have reviewed the triage vital signs and the nursing notes.  Pertinent labs & imaging results that were available during my care of the patient were reviewed by me and considered in my medical decision making (see chart for details).    Pain improving.  Home with anti-inflammatories, steroids, muscle relaxant, opioid pain medication, Lidoderm patch.  Close neurosurgical follow-up.  No indication for acute imaging today.  Recent MRI from 9 days ago  reviewed  Final Clinical Impressions(s) / ED Diagnoses   Final diagnoses:  Acute right-sided low back pain with right-sided sciatica    ED Discharge Orders    None       Azalia Bilisampos, Nicholson Starace, MD 03/16/18 1725

## 2018-03-16 NOTE — Discharge Instructions (Addendum)
Please call the neurosurgical office for follow-up

## 2018-03-16 NOTE — ED Triage Notes (Signed)
Pt reports increased lower back pain since his injury a couple weeks ago. Pt lifted a small box on Sunday while at work that exacerbated his pain. Hx of ruptured disc plan to get cortisone injection next week.

## 2018-03-19 ENCOUNTER — Other Ambulatory Visit: Payer: Self-pay | Admitting: Neurological Surgery

## 2018-03-19 DIAGNOSIS — M5416 Radiculopathy, lumbar region: Secondary | ICD-10-CM

## 2018-03-25 DIAGNOSIS — M5416 Radiculopathy, lumbar region: Secondary | ICD-10-CM | POA: Diagnosis not present

## 2018-04-07 ENCOUNTER — Ambulatory Visit
Admission: RE | Admit: 2018-04-07 | Discharge: 2018-04-07 | Disposition: A | Discharge status: Home / Self Care | Payer: BLUE CROSS/BLUE SHIELD | Source: Ambulatory Visit | Attending: Neurological Surgery | Admitting: Neurological Surgery

## 2018-04-07 DIAGNOSIS — M5126 Other intervertebral disc displacement, lumbar region: Secondary | ICD-10-CM | POA: Diagnosis not present

## 2018-04-07 DIAGNOSIS — M5416 Radiculopathy, lumbar region: Secondary | ICD-10-CM

## 2018-04-07 MED ORDER — METHYLPREDNISOLONE ACETATE 40 MG/ML INJ SUSP (RADIOLOG
120.0000 mg | Freq: Once | INTRAMUSCULAR | Status: AC
  Administered 2018-04-07: 120 mg via EPIDURAL

## 2018-04-07 MED ORDER — IOPAMIDOL (ISOVUE-M 200) INJECTION 41%
1.0000 mL | Freq: Once | INTRAMUSCULAR | Status: AC
  Administered 2018-04-07: 1 mL via EPIDURAL

## 2018-04-07 NOTE — Discharge Instructions (Signed)

## 2018-04-15 ENCOUNTER — Other Ambulatory Visit: Payer: Self-pay | Admitting: Neurological Surgery

## 2018-04-15 DIAGNOSIS — M5416 Radiculopathy, lumbar region: Secondary | ICD-10-CM

## 2018-04-26 ENCOUNTER — Ambulatory Visit
Admission: RE | Admit: 2018-04-26 | Discharge: 2018-04-26 | Disposition: A | Discharge status: Home / Self Care | Payer: BLUE CROSS/BLUE SHIELD | Source: Ambulatory Visit | Attending: Neurological Surgery | Admitting: Neurological Surgery

## 2018-04-26 DIAGNOSIS — M79605 Pain in left leg: Secondary | ICD-10-CM | POA: Diagnosis not present

## 2018-04-26 DIAGNOSIS — M5416 Radiculopathy, lumbar region: Secondary | ICD-10-CM

## 2018-04-26 MED ORDER — IOPAMIDOL (ISOVUE-M 200) INJECTION 41%
1.0000 mL | Freq: Once | INTRAMUSCULAR | Status: AC
  Administered 2018-04-26: 1 mL via EPIDURAL

## 2018-04-26 MED ORDER — METHYLPREDNISOLONE ACETATE 40 MG/ML INJ SUSP (RADIOLOG
120.0000 mg | Freq: Once | INTRAMUSCULAR | Status: AC
  Administered 2018-04-26: 120 mg via EPIDURAL

## 2018-05-07 DIAGNOSIS — Z6841 Body Mass Index (BMI) 40.0 and over, adult: Secondary | ICD-10-CM | POA: Diagnosis not present

## 2018-05-07 DIAGNOSIS — M5416 Radiculopathy, lumbar region: Secondary | ICD-10-CM | POA: Diagnosis not present

## 2018-06-04 ENCOUNTER — Other Ambulatory Visit: Payer: Self-pay | Admitting: Internal Medicine

## 2018-10-14 DIAGNOSIS — M5416 Radiculopathy, lumbar region: Secondary | ICD-10-CM | POA: Diagnosis not present

## 2018-10-20 ENCOUNTER — Telehealth: Payer: Self-pay | Admitting: Diagnostic Neuroimaging

## 2018-10-20 DIAGNOSIS — M47816 Spondylosis without myelopathy or radiculopathy, lumbar region: Secondary | ICD-10-CM | POA: Diagnosis not present

## 2018-10-20 NOTE — Telephone Encounter (Signed)
Pt gave consent.  Pt understands that although there may be some limitations with this type of visit, we will take all precautions to reduce any security or privacy concerns.  Pt understands that this will be treated like an in office visit and we will file with pt's insurance, and there may be a patient responsible charge related to this service. °

## 2018-10-21 ENCOUNTER — Encounter: Payer: Self-pay | Admitting: Diagnostic Neuroimaging

## 2018-10-21 NOTE — Telephone Encounter (Signed)
Spoke with patient and updated EMR. 

## 2018-10-21 NOTE — Addendum Note (Signed)
Addended by: Minna Antis on: 10/21/2018 09:06 AM   Modules accepted: Orders

## 2018-10-22 DIAGNOSIS — M47816 Spondylosis without myelopathy or radiculopathy, lumbar region: Secondary | ICD-10-CM | POA: Diagnosis not present

## 2018-10-25 ENCOUNTER — Other Ambulatory Visit: Payer: Self-pay

## 2018-10-25 ENCOUNTER — Ambulatory Visit (INDEPENDENT_AMBULATORY_CARE_PROVIDER_SITE_OTHER): Payer: BC Managed Care – PPO | Admitting: Diagnostic Neuroimaging

## 2018-10-25 DIAGNOSIS — R200 Anesthesia of skin: Secondary | ICD-10-CM | POA: Diagnosis not present

## 2018-10-25 NOTE — Progress Notes (Signed)
[facility]  PATIENT: Brian Fowler DOB: 04/09/1991  REFERRING CLINICIAN: Ostergard HISTORY FROM: patient  REASON FOR VISIT: new consult    HISTORICAL  CHIEF COMPLAINT:  Chief Complaint  Patient presents with  . Pain  . Numbness    HISTORY OF PRESENT ILLNESS:   28 year old male here for evaluation of numbness and tingling.    03/07/2018 patient was in a parking lot, when his car door struck him in the back.  He sat down hard on the ground.  He had immediate pain and pop sensation in his back and lower extremities.  Patient went to the emergency room had MRI of the thoracic and lumbar spine which were unremarkable except for disc protrusion at L2-3 and mild disc bulging at other levels.  Patient followed up in neurosurgery clinic and was referred for epidural steroid injections.  03/14/2018 patient went to work on light duty, lifted some boxes, and then felt another pop sensation in his lower back.  Patient continued to follow-up with epidural steroid injections.  Patient then followed up with orthopedic surgery.  Follow-up MRI lumbar spine in May 2020 showed resolution of prior disc protrusion at L2-3 level.  Over the past few months patient has had intermittent tingling in his legs greater than his arms.  He is continued to have difficulty with walking and balance.  Continues to have significant low back pain.  Patient is out of work.  He settled with his prior employer related to his injury on the job.    REVIEW OF SYSTEMS: Full 14 system review of systems performed and negative with exception of: As per HPI.  ALLERGIES: Allergies  Allergen Reactions  . Penicillins Anaphylaxis    Swelling, hives, SOB Has patient had a PCN reaction causing immediate rash, facial/tongue/throat swelling, SOB or lightheadedness with hypotension: No Has patient had a PCN reaction causing severe rash involving mucus membranes or skin necrosis: No Has patient had a PCN  reaction that required hospitalization: No Has patient had a PCN reaction occurring within the last 10 years: No If all of the above answers are "NO", then may proceed with Cephalosporin use.    HOME MEDICATIONS: Outpatient Medications Prior to Visit  Medication Sig Dispense Refill  . levothyroxine (SYNTHROID, LEVOTHROID) 150 MCG tablet TAKE 1 TABLET BY MOUTH EVERY DAY 90 tablet 0  . lidocaine (LIDODERM) 5 % Place 1 patch onto the skin daily. Remove & Discard patch within 12 hours or as directed by MD (Patient not taking: Reported on 10/21/2018) 15 patch 0  . methocarbamol (ROBAXIN) 500 MG tablet Take 1 tablet (500 mg total) by mouth every 8 (eight) hours as needed for muscle spasms. (Patient not taking: Reported on 10/21/2018) 12 tablet 0  . naproxen sodium (ALEVE) 220 MG tablet Take 440 mg by mouth as needed.    Marland Kitchen oxyCODONE-acetaminophen (PERCOCET/ROXICET) 5-325 MG tablet Take 1 tablet by mouth every 4 (four) hours as needed for severe pain. (Patient not taking: Reported on 10/21/2018) 15 tablet 0   No facility-administered medications prior to visit.     PAST MEDICAL HISTORY: Past Medical History:  Diagnosis Date  . Anxiety   . Thyroid disease    hypothyroidism     PAST SURGICAL HISTORY: Past Surgical History:  Procedure Laterality Date  . COSMETIC SURGERY  11/2011   for gynecomastia  . TONSILLECTOMY AND ADENOIDECTOMY      FAMILY HISTORY: Family History  Problem Relation Age of Onset  . Breast cancer Mother   .  Cancer Mother        breast cancer  . Hypertension Father   . Heart attack Father   . Arthritis Other        grandparent  . Prostate cancer Other        grandparent  . Hyperlipidemia Other        grandparent  . Hypertension Other        grandparent/parent  . Kidney disease Other        grandparent   . Diabetes Paternal Grandfather   . Early death Neg Hx   . Stroke Neg Hx     SOCIAL HISTORY: Social History   Socioeconomic History  . Marital status:  Single    Spouse name: Not on file  . Number of children: Not on file  . Years of education: Not on file  . Highest education level: Associate degree: occupational, Scientist, product/process developmenttechnical, or vocational program  Occupational History    Comment: 10/21/18 not employed  Social Needs  . Financial resource strain: Not on file  . Food insecurity    Worry: Not on file    Inability: Not on file  . Transportation needs    Medical: Not on file    Non-medical: Not on file  Tobacco Use  . Smoking status: Never Smoker  . Smokeless tobacco: Never Used  Substance and Sexual Activity  . Alcohol use: Yes    Alcohol/week: 1.0 standard drinks    Types: 1 Standard drinks or equivalent per week  . Drug use: No  . Sexual activity: Yes    Birth control/protection: Condom  Lifestyle  . Physical activity    Days per week: Not on file    Minutes per session: Not on file  . Stress: Not on file  Relationships  . Social Musicianconnections    Talks on phone: Not on file    Gets together: Not on file    Attends religious service: Not on file    Active member of club or organization: Not on file    Attends meetings of clubs or organizations: Not on file    Relationship status: Not on file  . Intimate partner violence    Fear of current or ex partner: Not on file    Emotionally abused: Not on file    Physically abused: Not on file    Forced sexual activity: Not on file  Other Topics Concern  . Not on file  Social History Narrative   ATTENDED  [facility]   HOBBIES: CARD STRATAGIES GAME: LIFT WEIGHTS AND RUNNING   No caffeine     PHYSICAL EXAM   VIDEO EXAM  GENERAL EXAM/CONSTITUTIONAL:  Vitals: There were no vitals filed for this visit.  There is no height or weight on file to calculate BMI. Wt Readings from Last 3 Encounters:  03/16/18 260 lb (117.9 kg)  06/17/17 269 lb (122 kg)  05/15/17 274 lb (124.3 kg)     Patient is in no distress; well developed, nourished and groomed; neck is supple   NEUROLOGIC:  MENTAL STATUS:  No flowsheet data found.  awake, alert, oriented to person, place and time  recent and remote memory intact  normal attention and concentration  language fluent, comprehension intact, naming intact  fund of knowledge appropriate  CRANIAL NERVE:   2nd, 3rd, 4th, 6th - visual fields full to confrontation, extraocular muscles intact, no nystagmus  5th - facial sensation symmetric  7th - facial strength symmetric  8th - hearing intact  11th -  shoulder shrug symmetric  12th - tongue protrusion midline  MOTOR:   NO TREMOR; NO DRIFT IN BUE  SENSORY:   normal and symmetric to light touch  COORDINATION:   fine finger movements normal      DIAGNOSTIC DATA (LABS, IMAGING, TESTING) - I reviewed patient records, labs, notes, testing and imaging myself where available.  Lab Results  Component Value Date   WBC 9.7 05/15/2017   HGB 15.7 05/15/2017   HCT 47.3 05/15/2017   MCV 86.8 05/15/2017   PLT 335.0 05/15/2017      Component Value Date/Time   NA 140 05/15/2017 1417   NA 140 06/26/2015   K 4.3 05/15/2017 1417   CL 102 05/15/2017 1417   CO2 33 (H) 05/15/2017 1417   GLUCOSE 96 05/15/2017 1417   BUN 16 05/15/2017 1417   BUN 14 06/26/2015   CREATININE 1.09 05/15/2017 1417   CALCIUM 9.4 05/15/2017 1417   PROT 7.8 05/15/2017 1417   ALBUMIN 4.7 05/15/2017 1417   AST 20 05/15/2017 1417   ALT 30 05/15/2017 1417   ALKPHOS 91 05/15/2017 1417   BILITOT 0.6 05/15/2017 1417   GFRNONAA >90 01/20/2013 1440   GFRAA >90 01/20/2013 1440   Lab Results  Component Value Date   CHOL 175 05/15/2017   HDL 35.20 (L) 05/15/2017   LDLCALC 107 (H) 05/15/2017   TRIG 161.0 (H) 05/15/2017   CHOLHDL 5 05/15/2017   Lab Results  Component Value Date   HGBA1C 5.4 06/26/2015   No results found for: VITAMINB12 Lab Results  Component Value Date   TSH 37.06 (H) 05/15/2017    03/07/18 MRI THORACIC / LUMBAR SPINE [I reviewed images myself and agree with  interpretation. -VRP] 1. Left paramedian disc protrusion with edematous endplate changes. This may be an acute or subacute disc rupture. There is moderate left central canal stenosis. The foramina are patent. 2. More chronic broad-based disc protrusion at L1-2 without significant stenosis. 3. Mild disc bulging at L3-4 without significant stenosis. 4. Mild foraminal narrowing bilaterally at T10-11 secondary to lateral disc bulging and mild bilateral facet hypertrophy. 5. Asymmetric right-sided facet hypertrophy at T4-5 without significant stenosis. 6. No other focal stenosis in the thoracic spine.    ASSESSMENT AND PLAN  28 y.o. year old male here with arm and leg numbness x 3 months. History of low back injury in November 2019 x 2.   Dx:  1. Numbness      Virtual Visit via Video Note  I connected with Casimiro NeedleJacob Pinkhasov on 10/25/18 at  4:00 PM EDT by a video enabled telemedicine application and verified that I am speaking with the correct person using two identifiers.  Location: Patient: home  Provider: office   I discussed the limitations of evaluation and management by telemedicine and the availability of in person appointments. The patient expressed understanding and agreed to proceed.   I discussed the assessment and treatment plan with the patient. The patient was provided an opportunity to ask questions and all were answered. The patient agreed with the plan and demonstrated an understanding of the instructions.   The patient was advised to call back or seek an in-person evaluation if the symptoms worsen or if the condition fails to improve as anticipated.  I provided 35 minutes of non-face-to-face time during this encounter.    PLAN:  NUMBNESS / PAIN / GAIT DIFFICULTY -I offered to check MRI brain, EMG / NCS and lab testing (however patient wants to hold off until further PT  evaluation; he is also limited financially at this time and wants to hold off on testing) -  continue PT evaluations  Return for pending if symptoms worsen or fail to improve.    Suanne MarkerVIKRAM R. PENUMALLI, MD 10/25/2018, 4:48 PM Certified in Neurology, Neurophysiology and Neuroimaging  [facility] [address], [address] [place], [place] [address] [phone]

## 2018-10-26 ENCOUNTER — Encounter: Payer: Self-pay | Admitting: Diagnostic Neuroimaging

## 2018-10-27 DIAGNOSIS — M47816 Spondylosis without myelopathy or radiculopathy, lumbar region: Secondary | ICD-10-CM | POA: Diagnosis not present

## 2018-10-28 DIAGNOSIS — M47816 Spondylosis without myelopathy or radiculopathy, lumbar region: Secondary | ICD-10-CM | POA: Diagnosis not present

## 2018-10-30 ENCOUNTER — Other Ambulatory Visit: Payer: Self-pay | Admitting: Internal Medicine

## 2018-11-01 DIAGNOSIS — M47816 Spondylosis without myelopathy or radiculopathy, lumbar region: Secondary | ICD-10-CM | POA: Diagnosis not present

## 2018-11-03 DIAGNOSIS — M47816 Spondylosis without myelopathy or radiculopathy, lumbar region: Secondary | ICD-10-CM | POA: Diagnosis not present

## 2018-11-08 DIAGNOSIS — M47816 Spondylosis without myelopathy or radiculopathy, lumbar region: Secondary | ICD-10-CM | POA: Diagnosis not present

## 2018-11-10 DIAGNOSIS — M47816 Spondylosis without myelopathy or radiculopathy, lumbar region: Secondary | ICD-10-CM | POA: Diagnosis not present

## 2018-11-17 DIAGNOSIS — M47816 Spondylosis without myelopathy or radiculopathy, lumbar region: Secondary | ICD-10-CM | POA: Diagnosis not present

## 2018-12-23 ENCOUNTER — Other Ambulatory Visit: Payer: Self-pay | Admitting: Neurological Surgery

## 2018-12-23 DIAGNOSIS — M5416 Radiculopathy, lumbar region: Secondary | ICD-10-CM

## 2018-12-29 ENCOUNTER — Other Ambulatory Visit: Payer: BLUE CROSS/BLUE SHIELD

## 2018-12-31 ENCOUNTER — Ambulatory Visit
Admission: RE | Admit: 2018-12-31 | Discharge: 2018-12-31 | Disposition: A | Discharge status: Home / Self Care | Payer: BC Managed Care – PPO | Source: Ambulatory Visit | Attending: Neurological Surgery | Admitting: Neurological Surgery

## 2018-12-31 ENCOUNTER — Other Ambulatory Visit: Payer: Self-pay | Admitting: Neurological Surgery

## 2018-12-31 ENCOUNTER — Other Ambulatory Visit: Payer: Self-pay

## 2018-12-31 DIAGNOSIS — M5416 Radiculopathy, lumbar region: Secondary | ICD-10-CM

## 2018-12-31 MED ORDER — IOPAMIDOL (ISOVUE-M 200) INJECTION 41%
1.0000 mL | Freq: Once | INTRAMUSCULAR | Status: AC
  Administered 2018-12-31: 1 mL via EPIDURAL

## 2018-12-31 MED ORDER — METHYLPREDNISOLONE ACETATE 40 MG/ML INJ SUSP (RADIOLOG
120.0000 mg | Freq: Once | INTRAMUSCULAR | Status: AC
  Administered 2018-12-31: 120 mg via EPIDURAL

## 2019-01-23 ENCOUNTER — Other Ambulatory Visit: Payer: Self-pay | Admitting: Internal Medicine

## 2019-02-27 ENCOUNTER — Other Ambulatory Visit: Payer: Self-pay | Admitting: Internal Medicine

## 2019-03-07 ENCOUNTER — Ambulatory Visit (INDEPENDENT_AMBULATORY_CARE_PROVIDER_SITE_OTHER): Payer: BC Managed Care – PPO | Admitting: Internal Medicine

## 2019-03-07 ENCOUNTER — Other Ambulatory Visit: Payer: Self-pay

## 2019-03-07 ENCOUNTER — Other Ambulatory Visit: Payer: Self-pay | Admitting: Internal Medicine

## 2019-03-07 ENCOUNTER — Encounter: Payer: Self-pay | Admitting: Internal Medicine

## 2019-03-07 ENCOUNTER — Other Ambulatory Visit (INDEPENDENT_AMBULATORY_CARE_PROVIDER_SITE_OTHER): Payer: BC Managed Care – PPO

## 2019-03-07 VITALS — BP 110/88 | HR 76 | Temp 98.3°F | Ht 65.0 in | Wt 287.0 lb

## 2019-03-07 DIAGNOSIS — E559 Vitamin D deficiency, unspecified: Secondary | ICD-10-CM

## 2019-03-07 DIAGNOSIS — E890 Postprocedural hypothyroidism: Secondary | ICD-10-CM

## 2019-03-07 DIAGNOSIS — Z0000 Encounter for general adult medical examination without abnormal findings: Secondary | ICD-10-CM | POA: Diagnosis not present

## 2019-03-07 DIAGNOSIS — E538 Deficiency of other specified B group vitamins: Secondary | ICD-10-CM

## 2019-03-07 DIAGNOSIS — R7309 Other abnormal glucose: Secondary | ICD-10-CM

## 2019-03-07 DIAGNOSIS — E039 Hypothyroidism, unspecified: Secondary | ICD-10-CM

## 2019-03-07 DIAGNOSIS — M519 Unspecified thoracic, thoracolumbar and lumbosacral intervertebral disc disorder: Secondary | ICD-10-CM | POA: Insufficient documentation

## 2019-03-07 LAB — URINALYSIS, ROUTINE W REFLEX MICROSCOPIC
Bilirubin Urine: NEGATIVE
Hgb urine dipstick: NEGATIVE
Ketones, ur: NEGATIVE
Leukocytes,Ua: NEGATIVE
Nitrite: NEGATIVE
RBC / HPF: NONE SEEN (ref 0–?)
Specific Gravity, Urine: 1.02 (ref 1.000–1.030)
Total Protein, Urine: NEGATIVE
Urine Glucose: NEGATIVE
Urobilinogen, UA: 0.2 (ref 0.0–1.0)
WBC, UA: NONE SEEN (ref 0–?)
pH: 7 (ref 5.0–8.0)

## 2019-03-07 LAB — BASIC METABOLIC PANEL
BUN: 17 mg/dL (ref 6–23)
CO2: 28 mEq/L (ref 19–32)
Calcium: 9.1 mg/dL (ref 8.4–10.5)
Chloride: 103 mEq/L (ref 96–112)
Creatinine, Ser: 0.92 mg/dL (ref 0.40–1.50)
GFR: 97.17 mL/min (ref >60.00)
Glucose, Bld: 95 mg/dL (ref 70–99)
Potassium: 4.4 mEq/L (ref 3.5–5.1)
Sodium: 138 mEq/L (ref 135–145)

## 2019-03-07 LAB — CBC WITH DIFFERENTIAL/PLATELET
Basophils Absolute: 0.1 10*3/uL (ref 0.0–0.1)
Basophils Relative: 0.7 % (ref 0.0–3.0)
Eosinophils Absolute: 0.6 10*3/uL (ref 0.0–0.7)
Eosinophils Relative: 5.8 % — ABNORMAL HIGH (ref 0.0–5.0)
HCT: 46.3 % (ref 39.0–52.0)
Hemoglobin: 15.6 g/dL (ref 13.0–17.0)
Lymphocytes Relative: 33.9 % (ref 12.0–46.0)
Lymphs Abs: 3.3 10*3/uL (ref 0.7–4.0)
MCHC: 33.7 g/dL (ref 30.0–36.0)
MCV: 86.6 fl (ref 78.0–100.0)
Monocytes Absolute: 0.6 10*3/uL (ref 0.1–1.0)
Monocytes Relative: 6 % (ref 3.0–12.0)
Neutro Abs: 5.3 10*3/uL (ref 1.4–7.7)
Neutrophils Relative %: 53.6 % (ref 43.0–77.0)
Platelets: 316 10*3/uL (ref 150.0–400.0)
RBC: 5.34 Mil/uL (ref 4.22–5.81)
RDW: 14.3 % (ref 11.5–15.5)
WBC: 9.9 10*3/uL (ref 4.0–10.5)

## 2019-03-07 LAB — VITAMIN B12: Vitamin B-12: 433 pg/mL (ref 211–911)

## 2019-03-07 LAB — HEPATIC FUNCTION PANEL
ALT: 26 U/L (ref 0–53)
AST: 17 U/L (ref 0–37)
Albumin: 4.5 g/dL (ref 3.5–5.2)
Alkaline Phosphatase: 89 U/L (ref 39–117)
Bilirubin, Direct: 0 mg/dL (ref 0.0–0.3)
Total Bilirubin: 0.4 mg/dL (ref 0.2–1.2)
Total Protein: 7.2 g/dL (ref 6.0–8.3)

## 2019-03-07 LAB — LIPID PANEL
Cholesterol: 191 mg/dL (ref 0–200)
HDL: 31.3 mg/dL — ABNORMAL LOW (ref >39.00)
LDL Cholesterol: 126 mg/dL — ABNORMAL HIGH (ref 0–99)
NonHDL: 160.07
Total CHOL/HDL Ratio: 6
Triglycerides: 171 mg/dL — ABNORMAL HIGH (ref 0.0–149.0)
VLDL: 34.2 mg/dL (ref 0.0–40.0)

## 2019-03-07 LAB — T4, FREE: Free T4: 0.93 ng/dL (ref 0.60–1.60)

## 2019-03-07 LAB — VITAMIN D 25 HYDROXY (VIT D DEFICIENCY, FRACTURES): VITD: 9.4 ng/mL — ABNORMAL LOW (ref 30.00–100.00)

## 2019-03-07 LAB — TSH: TSH: 30.23 u[IU]/mL — ABNORMAL HIGH (ref 0.35–4.50)

## 2019-03-07 LAB — HEMOGLOBIN A1C: Hgb A1c MFr Bld: 5.5 % (ref 4.6–6.5)

## 2019-03-07 MED ORDER — VITAMIN D (ERGOCALCIFEROL) 1.25 MG (50000 UNIT) PO CAPS: 50000.0000 [IU] | ORAL_CAPSULE | ORAL | 0 refills | Status: DC

## 2019-03-07 MED ORDER — LEVOTHYROXINE SODIUM 150 MCG PO TABS: 150.0000 ug | ORAL_TABLET | Freq: Every day | ORAL | 3 refills | Status: DC

## 2019-03-07 MED ORDER — LEVOTHYROXINE SODIUM 175 MCG PO TABS: 175.0000 ug | ORAL_TABLET | Freq: Every day | ORAL | 3 refills | Status: DC

## 2019-03-07 NOTE — Patient Instructions (Signed)

## 2019-03-07 NOTE — Progress Notes (Signed)
Subjective:    Patient ID: Brian Fowler, male    DOB: 1991/03/18, 28 y.o.   MRN: [id number]  HPI  Here for wellness and f/u;  Overall doing ok;  Pt denies Chest pain, worsening SOB, DOE, wheezing, orthopnea, PND, worsening LE edema, palpitations, dizziness or syncope.  Pt denies neurological change such as new headache, facial or extremity weakness.  Pt denies polydipsia, polyuria, or low sugar symptoms. Pt states overall good compliance with treatment and medications, good tolerability, and has been trying to follow appropriate diet.  Pt denies worsening depressive symptoms, suicidal ideation or panic. No fever, night sweats, wt loss, loss of appetite, or other constitutional symptoms.  Pt states good ability with ADL's, has low fall risk, home safety reviewed and adequate, no other significant changes in hearing or vision, and only occasionally active with exercise. Gaied wt with the covid pandemic Wt Readings from Last 3 Encounters:  03/07/19 287 lb (130.2 kg)  03/16/18 260 lb (117.9 kg)  06/17/17 269 lb (122 kg)   Past Medical History:  Diagnosis Date  . Anxiety   . Thyroid disease    hypothyroidism    Past Surgical History:  Procedure Laterality Date  . COSMETIC SURGERY  11/2011   for gynecomastia  . TONSILLECTOMY AND ADENOIDECTOMY      reports that he has never smoked. He has never used smokeless tobacco. He reports current alcohol use of about 1.0 standard drinks of alcohol per week. He reports that he does not use drugs. family history includes Arthritis in an other family member; Breast cancer in his mother; Cancer in his mother; Diabetes in his paternal grandfather; Heart attack in his father; Hyperlipidemia in an other family member; Hypertension in his father and another family member; Kidney disease in an other family member; Prostate cancer in an other family member. Allergies  Allergen Reactions  . Penicillins Anaphylaxis    Swelling, hives, SOB Has patient had a PCN  reaction causing immediate rash, facial/tongue/throat swelling, SOB or lightheadedness with hypotension: No Has patient had a PCN reaction causing severe rash involving mucus membranes or skin necrosis: No Has patient had a PCN reaction that required hospitalization: No Has patient had a PCN reaction occurring within the last 10 years: No If all of the above answers are "NO", then may proceed with Cephalosporin use.   No current outpatient medications on file prior to visit.   No current facility-administered medications on file prior to visit.    Review of Systems Constitutional: Negative for other unusual diaphoresis, sweats, appetite or weight changes HENT: Negative for other worsening hearing loss, ear pain, facial swelling, mouth sores or neck stiffness.   Eyes: Negative for other worsening pain, redness or other visual disturbance.  Respiratory: Negative for other stridor or swelling Cardiovascular: Negative for other palpitations or other chest pain  Gastrointestinal: Negative for worsening diarrhea or loose stools, blood in stool, distention or other pain Genitourinary: Negative for hematuria, flank pain or other change in urine volume.  Musculoskeletal: Negative for myalgias or other joint swelling.  Skin: Negative for other color change, or other wound or worsening drainage.  Neurological: Negative for other syncope or numbness. Hematological: Negative for other adenopathy or swelling Psychiatric/Behavioral: Negative for hallucinations, other worsening agitation, SI, self-injury, or new decreased concentration All otherwise neg per pt    Objective:   Physical Exam BP 110/88 (BP Location: Left Arm, Patient Position: Sitting, Cuff Size: Large)   Pulse 76   Temp 98.3 F (36.8 C) (Oral)  Ht 5\' 5"  (1.651 m)   Wt 287 lb (130.2 kg)   SpO2 99%   BMI 47.76 kg/m  VS noted,  Constitutional: Pt is oriented to person, place, and time. Appears well-developed and well-nourished, in  no significant distress and comfortable Head: Normocephalic and atraumatic  Eyes: Conjunctivae and EOM are normal. Pupils are equal, round, and reactive to light Right Ear: External ear normal without discharge Left Ear: External ear normal without discharge Nose: Nose without discharge or deformity Mouth/Throat: Oropharynx is without other ulcerations and moist  Neck: Normal range of motion. Neck supple. No JVD present. No tracheal deviation present or significant neck LA or mass Cardiovascular: Normal rate, regular rhythm, normal heart sounds and intact distal pulses.   Pulmonary/Chest: WOB normal and breath sounds without rales or wheezing  Abdominal: Soft. Bowel sounds are normal. NT. No HSM  Musculoskeletal: Normal range of motion. Exhibits no edema Lymphadenopathy: Has no other cervical adenopathy.  Neurological: Pt is alert and oriented to person, place, and time. Pt has normal reflexes. No cranial nerve deficit. Motor grossly intact, Gait intact Skin: Skin is warm and dry. No rash noted or new ulcerations Psychiatric:  Has normal mood and affect. Behavior is normal without agitation All otherwise neg per pt  Lab Results  Component Value Date   WBC 9.9 03/07/2019   HGB 15.6 03/07/2019   HCT 46.3 03/07/2019   PLT 316.0 03/07/2019   GLUCOSE 95 03/07/2019   CHOL 191 03/07/2019   TRIG 171.0 (H) 03/07/2019   HDL 31.30 (L) 03/07/2019   LDLCALC 126 (H) 03/07/2019   ALT 26 03/07/2019   AST 17 03/07/2019   NA 138 03/07/2019   K 4.4 03/07/2019   CL 103 03/07/2019   CREATININE 0.92 03/07/2019   BUN 17 03/07/2019   CO2 28 03/07/2019   TSH 30.23 (H) 03/07/2019   HGBA1C 5.5 03/07/2019       Assessment & Plan:

## 2019-03-09 ENCOUNTER — Encounter: Payer: Self-pay | Admitting: Internal Medicine

## 2019-03-09 NOTE — Assessment & Plan Note (Signed)
stable overall by history and exam, recent data reviewed with pt, and pt to continue medical treatment as before,  to f/u any worsening symptoms or concerns  

## 2019-03-09 NOTE — Assessment & Plan Note (Signed)

## 2019-03-09 NOTE — Assessment & Plan Note (Signed)
For a1c with labs, wt and diet control

## 2019-05-22 ENCOUNTER — Other Ambulatory Visit: Payer: Self-pay | Admitting: Internal Medicine

## 2019-05-22 NOTE — Telephone Encounter (Signed)
No need further high dose vit d  Please plan to change to OTC Vitamin D3 at 2000 units per day, indefinitely.  

## 2019-06-04 ENCOUNTER — Other Ambulatory Visit: Payer: Self-pay | Admitting: Internal Medicine

## 2019-06-04 NOTE — Telephone Encounter (Signed)
No need furhter high dose vit d  Please take OTC Vitamin D3 at 2000 units per day, indefinitely.

## 2019-06-06 ENCOUNTER — Other Ambulatory Visit: Payer: Self-pay | Admitting: Internal Medicine

## 2019-07-05 DIAGNOSIS — Z23 Encounter for immunization: Secondary | ICD-10-CM | POA: Diagnosis not present

## 2019-08-02 DIAGNOSIS — Z23 Encounter for immunization: Secondary | ICD-10-CM | POA: Diagnosis not present

## 2020-03-05 ENCOUNTER — Other Ambulatory Visit: Payer: Self-pay | Admitting: Internal Medicine

## 2020-03-05 NOTE — Telephone Encounter (Signed)
lease refill as per office routine med refill policy (all routine meds refilled for 3 mo or monthly per pt preference up to one year from last visit, then month to month grace period for 3 mo, then further med refills will have to be denied)  

## 2021-02-27 ENCOUNTER — Other Ambulatory Visit: Payer: Self-pay | Admitting: Internal Medicine

## 2021-02-27 NOTE — Telephone Encounter (Signed)
Please refill as per office routine med refill policy (all routine meds to be refilled for 3 mo or monthly (per pt preference) up to one year from last visit, then month to month grace period for 3 mo, then further med refills will have to be denied) ? ?

## 2021-03-01 ENCOUNTER — Other Ambulatory Visit: Payer: Self-pay | Admitting: Internal Medicine

## 2021-03-01 NOTE — Telephone Encounter (Signed)
Please refill as per office routine med refill policy (all routine meds to be refilled for 3 mo or monthly (per pt preference) up to one year from last visit, then month to month grace period for 3 mo, then further med refills will have to be denied) ? ?

## 2021-04-17 ENCOUNTER — Other Ambulatory Visit: Payer: Self-pay | Admitting: Internal Medicine

## 2021-04-17 ENCOUNTER — Ambulatory Visit (INDEPENDENT_AMBULATORY_CARE_PROVIDER_SITE_OTHER): Payer: 59 | Admitting: Internal Medicine

## 2021-04-17 ENCOUNTER — Other Ambulatory Visit: Payer: Self-pay

## 2021-04-17 ENCOUNTER — Encounter: Payer: Self-pay | Admitting: Internal Medicine

## 2021-04-17 VITALS — BP 100/60 | HR 70 | Temp 98.6°F | Ht 65.0 in | Wt 280.0 lb

## 2021-04-17 DIAGNOSIS — E890 Postprocedural hypothyroidism: Secondary | ICD-10-CM

## 2021-04-17 DIAGNOSIS — Z0000 Encounter for general adult medical examination without abnormal findings: Secondary | ICD-10-CM | POA: Diagnosis not present

## 2021-04-17 DIAGNOSIS — Z0001 Encounter for general adult medical examination with abnormal findings: Secondary | ICD-10-CM | POA: Diagnosis not present

## 2021-04-17 DIAGNOSIS — Z1159 Encounter for screening for other viral diseases: Secondary | ICD-10-CM | POA: Diagnosis not present

## 2021-04-17 DIAGNOSIS — R7309 Other abnormal glucose: Secondary | ICD-10-CM

## 2021-04-17 DIAGNOSIS — E559 Vitamin D deficiency, unspecified: Secondary | ICD-10-CM | POA: Diagnosis not present

## 2021-04-17 LAB — CBC WITH DIFFERENTIAL/PLATELET
Basophils Absolute: 0 10*3/uL (ref 0.0–0.1)
Basophils Relative: 0.4 % (ref 0.0–3.0)
Eosinophils Absolute: 0.5 10*3/uL (ref 0.0–0.7)
Eosinophils Relative: 6.2 % — ABNORMAL HIGH (ref 0.0–5.0)
HCT: 46.4 % (ref 39.0–52.0)
Hemoglobin: 15.6 g/dL (ref 13.0–17.0)
Lymphocytes Relative: 30.9 % (ref 12.0–46.0)
Lymphs Abs: 2.3 10*3/uL (ref 0.7–4.0)
MCHC: 33.6 g/dL (ref 30.0–36.0)
MCV: 84.3 fl (ref 78.0–100.0)
Monocytes Absolute: 0.3 10*3/uL (ref 0.1–1.0)
Monocytes Relative: 4.5 % (ref 3.0–12.0)
Neutro Abs: 4.2 10*3/uL (ref 1.4–7.7)
Neutrophils Relative %: 58 % (ref 43.0–77.0)
Platelets: 303 10*3/uL (ref 150.0–400.0)
RBC: 5.51 Mil/uL (ref 4.22–5.81)
RDW: 14.2 % (ref 11.5–15.5)
WBC: 7.3 10*3/uL (ref 4.0–10.5)

## 2021-04-17 LAB — LIPID PANEL
Cholesterol: 188 mg/dL (ref 0–200)
HDL: 38.5 mg/dL — ABNORMAL LOW (ref >39.00)
LDL Cholesterol: 125 mg/dL — ABNORMAL HIGH (ref 0–99)
NonHDL: 149.89
Total CHOL/HDL Ratio: 5
Triglycerides: 122 mg/dL (ref 0.0–149.0)
VLDL: 24.4 mg/dL (ref 0.0–40.0)

## 2021-04-17 LAB — BASIC METABOLIC PANEL
BUN: 14 mg/dL (ref 6–23)
CO2: 29 mEq/L (ref 19–32)
Calcium: 9.5 mg/dL (ref 8.4–10.5)
Chloride: 104 mEq/L (ref 96–112)
Creatinine, Ser: 1 mg/dL (ref 0.40–1.50)
GFR: 100.47 mL/min (ref >60.00)
Glucose, Bld: 118 mg/dL — ABNORMAL HIGH (ref 70–99)
Potassium: 4.9 mEq/L (ref 3.5–5.1)
Sodium: 140 mEq/L (ref 135–145)

## 2021-04-17 LAB — HEPATIC FUNCTION PANEL
ALT: 29 U/L (ref 0–53)
AST: 20 U/L (ref 0–37)
Albumin: 4.3 g/dL (ref 3.5–5.2)
Alkaline Phosphatase: 88 U/L (ref 39–117)
Bilirubin, Direct: 0.1 mg/dL (ref 0.0–0.3)
Total Bilirubin: 0.5 mg/dL (ref 0.2–1.2)
Total Protein: 7.4 g/dL (ref 6.0–8.3)

## 2021-04-17 LAB — URINALYSIS, ROUTINE W REFLEX MICROSCOPIC
Bilirubin Urine: NEGATIVE
Hgb urine dipstick: NEGATIVE
Ketones, ur: NEGATIVE
Leukocytes,Ua: NEGATIVE
Nitrite: NEGATIVE
RBC / HPF: NONE SEEN (ref 0–?)
Specific Gravity, Urine: 1.025 (ref 1.000–1.030)
Total Protein, Urine: NEGATIVE
Urine Glucose: NEGATIVE
Urobilinogen, UA: 0.2 (ref 0.0–1.0)
pH: 5.5 (ref 5.0–8.0)

## 2021-04-17 LAB — TSH: TSH: 23.37 u[IU]/mL — ABNORMAL HIGH (ref 0.35–5.50)

## 2021-04-17 LAB — VITAMIN D 25 HYDROXY (VIT D DEFICIENCY, FRACTURES): VITD: 18.51 ng/mL — ABNORMAL LOW (ref 30.00–100.00)

## 2021-04-17 LAB — T4, FREE: Free T4: 0.52 ng/dL — ABNORMAL LOW (ref 0.60–1.60)

## 2021-04-17 MED ORDER — LEVOTHYROXINE SODIUM 200 MCG PO TABS: 200.0000 ug | ORAL_TABLET | Freq: Every day | ORAL | 3 refills | Status: DC

## 2021-04-17 MED ORDER — CHOLECALCIFEROL 50 MCG (2000 UT) PO TABS
ORAL_TABLET | ORAL | 99 refills | Status: DC
Start: 2021-04-17 — End: 2022-04-22

## 2021-04-17 NOTE — Assessment & Plan Note (Signed)
Lab Results  Component Value Date   TSH 23.37 (H) 04/17/2021   Uncontrolled, pt to increase levothyroine to 200 mcg

## 2021-04-17 NOTE — Assessment & Plan Note (Signed)
Age and sex appropriate education and counseling updated with regular exercise and diet Referrals for preventative services - for hep c screen Immunizations addressed - declines covid booster and tdap Smoking counseling  - none needed Evidence for depression or other mood disorder - chronic anxiety followed per psychiatry Most recent labs reviewed. I have personally reviewed and have noted: 1) the patient's medical and social history 2) The patient's current medications and supplements 3) The patient's height, weight, and BMI have been recorded in the chart

## 2021-04-17 NOTE — Assessment & Plan Note (Signed)
Last vitamin D Lab Results  Component Value Date   VD25OH 9.40 (L) 03/07/2019   Low, to start oral replacement

## 2021-04-17 NOTE — Patient Instructions (Signed)
Please take OTC Vitamin D3 at 2000 units per day, indefinitely  Please continue all other medications as before, and refills have been done if requested.  Please have the pharmacy call with any other refills you may need.  Please continue your efforts at being more active, low cholesterol diet, and weight control.  You are otherwise up to date with prevention measures today.  Please keep your appointments with your specialists as you may have planned  Please go to the LAB at the blood drawing area for the tests to be done  You will be contacted by phone if any changes need to be made immediately.  Otherwise, you will receive a letter about your results with an explanation, but please check with MyChart first.  Please remember to sign up for MyChart if you have not done so, as this will be important to you in the future with finding out test results, communicating by private email, and scheduling acute appointments online when needed.  Please make an Appointment to return for your 1 year visit, or sooner if needed 

## 2021-04-17 NOTE — Assessment & Plan Note (Signed)
Lab Results  Component Value Date   HGBA1C 5.5 03/07/2019   Stable, pt to continue current medical treatment  - diet

## 2021-04-17 NOTE — Progress Notes (Signed)
Patient ID: Brian Fowler, male   DOB: 10-17-1990, 30 y.o.   MRN: [id number]         Chief Complaint:: yearly exam and Medication Refill         HPI:  Brian Fowler is a 30 y.o. male here for above; due for hep c screen, declines covid booster, and tdap, o/w up to date  Had unfortunate fal lthrough a booth seat at [employer] that had dry rotted, with back pain recurred similar to the past, now getting PT, still with pain to lower back and intermittent distal pain to both legs to just below the knees.   Pt denies chest pain, increased sob or doe, wheezing, orthopnea, PND, increased LE swelling, palpitations, dizziness or syncope.   Pt denies polydipsia, polyuria, or new focal neuro s/s.  Has new full time job at [employer] starting soon.  Not taking Vit d.  .Denies hyper or hypo thyroid symptoms such as voice, skin or hair change.  Pt denies fever, wt loss, night sweats, loss of appetite, or other constitutional symptoms  Wt Readings from Last 3 Encounters:  04/17/21 280 lb (127 kg)  03/07/19 287 lb (130.2 kg)  03/16/18 260 lb (117.9 kg)   BP Readings from Last 3 Encounters:  04/17/21 100/60  03/07/19 110/88  12/31/18 123/72   Immunization History  Administered Date(s) Administered   Influenza Whole 02/03/2012, 02/02/2013   Influenza,inj,Quad PF,6+ Mos 02/15/2019   Influenza-Unspecified 02/15/2015, 02/26/2017, 02/11/2021   Moderna Sars-Covid-2 Vaccination 07/05/2019, 08/02/2019, 02/01/2020   Tdap 06/06/2007   Health Maintenance Due  Topic Date Due   Hepatitis C Screening  Never done      Past Medical History:  Diagnosis Date   Anxiety    Thyroid disease    hypothyroidism    Past Surgical History:  Procedure Laterality Date   COSMETIC SURGERY  11/2011   for gynecomastia   TONSILLECTOMY AND ADENOIDECTOMY      reports that he has never smoked. He has never used smokeless tobacco. He reports current alcohol use of about 1.0 standard drink per week. He reports that he does  not use drugs. family history includes Arthritis in an other family member; Breast cancer in his mother; Cancer in his mother; Diabetes in his paternal grandfather; Heart attack in his father; Hyperlipidemia in an other family member; Hypertension in his father and another family member; Kidney disease in an other family member; Prostate cancer in an other family member. Allergies  Allergen Reactions   Penicillins Anaphylaxis    Swelling, hives, SOB Has patient had a PCN reaction causing immediate rash, facial/tongue/throat swelling, SOB or lightheadedness with hypotension: No Has patient had a PCN reaction causing severe rash involving mucus membranes or skin necrosis: No Has patient had a PCN reaction that required hospitalization: No Has patient had a PCN reaction occurring within the last 10 years: No If all of the above answers are "NO", then may proceed with Cephalosporin use.   Current Outpatient Medications on File Prior to Visit  Medication Sig Dispense Refill   Vitamin D, Ergocalciferol, (DRISDOL) 1.25 MG (50000 UT) CAPS capsule Take 1 capsule (50,000 Units total) by mouth every 7 (seven) days. (Patient not taking: Reported on 04/17/2021) 12 capsule 0   No current facility-administered medications on file prior to visit.        ROS:  All others reviewed and negative.  Objective        PE:  BP 100/60 (BP Location: Right Arm, Patient Position: Sitting, Cuff  Size: Large)    Pulse 70    Temp 98.6 F (37 C) (Oral)    Ht 5\' 5"  (1.651 m)    Wt 280 lb (127 kg)    SpO2 96%    BMI 46.59 kg/m                 Constitutional: Pt appears in NAD               HENT: Head: NCAT.                Right Ear: External ear normal.                 Left Ear: External ear normal.                Eyes: . Pupils are equal, round, and reactive to light. Conjunctivae and EOM are normal               Nose: without d/c or deformity               Neck: Neck supple. Gross normal ROM                Cardiovascular: Normal rate and regular rhythm.                 Pulmonary/Chest: Effort normal and breath sounds without rales or wheezing.                Abd:  Soft, NT, ND, + BS, no organomegaly               Neurological: Pt is alert. At baseline orientation, motor grossly intact               Skin: Skin is warm. No rashes, no other new lesions, LE edema - none               Psychiatric: Pt behavior is normal without agitation   Micro: none  Cardiac tracings I have personally interpreted today:  none  Pertinent Radiological findings (summarize): none   Lab Results  Component Value Date   WBC 7.3 04/17/2021   HGB 15.6 04/17/2021   HCT 46.4 04/17/2021   PLT 303.0 04/17/2021   GLUCOSE 118 (H) 04/17/2021   CHOL 188 04/17/2021   TRIG 122.0 04/17/2021   HDL 38.50 (L) 04/17/2021   LDLCALC 125 (H) 04/17/2021   ALT 29 04/17/2021   AST 20 04/17/2021   NA 140 04/17/2021   K 4.9 04/17/2021   CL 104 04/17/2021   CREATININE 1.00 04/17/2021   BUN 14 04/17/2021   CO2 29 04/17/2021   TSH 23.37 (H) 04/17/2021   HGBA1C 5.5 03/07/2019   Assessment/Plan:  Brian Fowler is a 30 y.o. White or Caucasian [1] male with  has a past medical history of Anxiety and Thyroid disease.  Vitamin D deficiency Last vitamin D Lab Results  Component Value Date   VD25OH 9.40 (L) 03/07/2019   Low, to start oral replacement  Encounter for well adult exam with abnormal findings Age and sex appropriate education and counseling updated with regular exercise and diet Referrals for preventative services - for hep c screen Immunizations addressed - declines covid booster and tdap Smoking counseling  - none needed Evidence for depression or other mood disorder - chronic anxiety followed per psychiatry Most recent labs reviewed. I have personally reviewed and have noted: 1) the patient's medical and social history 2) The patient's current medications and supplements 3) The  patient's height, weight, and BMI  have been recorded in the chart   Hypothyroidism Lab Results  Component Value Date   TSH 23.37 (H) 04/17/2021   Uncontrolled, pt to increase levothyroine to 200 mcg   Other abnormal glucose Lab Results  Component Value Date   HGBA1C 5.5 03/07/2019   Stable, pt to continue current medical treatment  - diet  Followup: Return in about 1 year (around 04/17/2022).  Oliver Barre, MD 04/17/2021 9:30 PM [facility]

## 2021-04-18 LAB — HEPATITIS C ANTIBODY
Hepatitis C Ab: NONREACTIVE
SIGNAL TO CUT-OFF: 0.03 (ref <1.00)

## 2022-04-04 ENCOUNTER — Other Ambulatory Visit: Payer: Self-pay | Admitting: Internal Medicine

## 2022-04-22 ENCOUNTER — Ambulatory Visit: Payer: 59 | Admitting: Internal Medicine

## 2022-04-22 ENCOUNTER — Encounter: Payer: Self-pay | Admitting: Internal Medicine

## 2022-04-22 VITALS — BP 118/66 | HR 91 | Temp 98.1°F | Ht 65.0 in | Wt 267.0 lb

## 2022-04-22 DIAGNOSIS — R7309 Other abnormal glucose: Secondary | ICD-10-CM

## 2022-04-22 DIAGNOSIS — F419 Anxiety disorder, unspecified: Secondary | ICD-10-CM

## 2022-04-22 DIAGNOSIS — M5416 Radiculopathy, lumbar region: Secondary | ICD-10-CM | POA: Diagnosis not present

## 2022-04-22 DIAGNOSIS — E538 Deficiency of other specified B group vitamins: Secondary | ICD-10-CM | POA: Diagnosis not present

## 2022-04-22 DIAGNOSIS — Z0001 Encounter for general adult medical examination with abnormal findings: Secondary | ICD-10-CM | POA: Diagnosis not present

## 2022-04-22 DIAGNOSIS — M79605 Pain in left leg: Secondary | ICD-10-CM

## 2022-04-22 DIAGNOSIS — E890 Postprocedural hypothyroidism: Secondary | ICD-10-CM | POA: Diagnosis not present

## 2022-04-22 DIAGNOSIS — L739 Follicular disorder, unspecified: Secondary | ICD-10-CM

## 2022-04-22 DIAGNOSIS — L80 Vitiligo: Secondary | ICD-10-CM | POA: Insufficient documentation

## 2022-04-22 DIAGNOSIS — E559 Vitamin D deficiency, unspecified: Secondary | ICD-10-CM

## 2022-04-22 LAB — HEPATIC FUNCTION PANEL
ALT: 26 U/L (ref 0–53)
AST: 17 U/L (ref 0–37)
Albumin: 4.5 g/dL (ref 3.5–5.2)
Alkaline Phosphatase: 99 U/L (ref 39–117)
Bilirubin, Direct: 0.1 mg/dL (ref 0.0–0.3)
Total Bilirubin: 0.5 mg/dL (ref 0.2–1.2)
Total Protein: 7.5 g/dL (ref 6.0–8.3)

## 2022-04-22 LAB — T4, FREE: Free T4: 1.54 ng/dL (ref 0.60–1.60)

## 2022-04-22 LAB — CBC WITH DIFFERENTIAL/PLATELET
Basophils Absolute: 0 10*3/uL (ref 0.0–0.1)
Basophils Relative: 0.5 % (ref 0.0–3.0)
Eosinophils Absolute: 0.3 10*3/uL (ref 0.0–0.7)
Eosinophils Relative: 3.9 % (ref 0.0–5.0)
HCT: 47.7 % (ref 39.0–52.0)
Hemoglobin: 16.1 g/dL (ref 13.0–17.0)
Lymphocytes Relative: 28.2 % (ref 12.0–46.0)
Lymphs Abs: 2.5 10*3/uL (ref 0.7–4.0)
MCHC: 33.9 g/dL (ref 30.0–36.0)
MCV: 84.2 fl (ref 78.0–100.0)
Monocytes Absolute: 0.6 10*3/uL (ref 0.1–1.0)
Monocytes Relative: 6.3 % (ref 3.0–12.0)
Neutro Abs: 5.4 10*3/uL (ref 1.4–7.7)
Neutrophils Relative %: 61.1 % (ref 43.0–77.0)
Platelets: 372 10*3/uL (ref 150.0–400.0)
RBC: 5.66 Mil/uL (ref 4.22–5.81)
RDW: 13.8 % (ref 11.5–15.5)
WBC: 8.9 10*3/uL (ref 4.0–10.5)

## 2022-04-22 LAB — URINALYSIS, ROUTINE W REFLEX MICROSCOPIC
Hgb urine dipstick: NEGATIVE
Ketones, ur: NEGATIVE
Leukocytes,Ua: NEGATIVE
Nitrite: NEGATIVE
RBC / HPF: NONE SEEN (ref 0–?)
Specific Gravity, Urine: >=1.03 — AB (ref 1.000–1.030)
Total Protein, Urine: NEGATIVE
Urine Glucose: NEGATIVE
Urobilinogen, UA: 0.2 (ref 0.0–1.0)
pH: 5.5 (ref 5.0–8.0)

## 2022-04-22 LAB — BASIC METABOLIC PANEL
BUN: 12 mg/dL (ref 6–23)
CO2: 28 mEq/L (ref 19–32)
Calcium: 9.5 mg/dL (ref 8.4–10.5)
Chloride: 103 mEq/L (ref 96–112)
Creatinine, Ser: 0.95 mg/dL (ref 0.40–1.50)
GFR: 106.09 mL/min (ref >60.00)
Glucose, Bld: 103 mg/dL — ABNORMAL HIGH (ref 70–99)
Potassium: 3.9 mEq/L (ref 3.5–5.1)
Sodium: 140 mEq/L (ref 135–145)

## 2022-04-22 LAB — LIPID PANEL
Cholesterol: 173 mg/dL (ref 0–200)
HDL: 34.8 mg/dL — ABNORMAL LOW (ref >39.00)
LDL Cholesterol: 116 mg/dL — ABNORMAL HIGH (ref 0–99)
NonHDL: 137.84
Total CHOL/HDL Ratio: 5
Triglycerides: 109 mg/dL (ref 0.0–149.0)
VLDL: 21.8 mg/dL (ref 0.0–40.0)

## 2022-04-22 LAB — TSH: TSH: 0.01 u[IU]/mL — ABNORMAL LOW (ref 0.35–5.50)

## 2022-04-22 LAB — VITAMIN D 25 HYDROXY (VIT D DEFICIENCY, FRACTURES): VITD: 16.73 ng/mL — ABNORMAL LOW (ref 30.00–100.00)

## 2022-04-22 LAB — HEMOGLOBIN A1C: Hgb A1c MFr Bld: 5.6 % (ref 4.6–6.5)

## 2022-04-22 LAB — VITAMIN B12: Vitamin B-12: 258 pg/mL (ref 211–911)

## 2022-04-22 MED ORDER — GABAPENTIN 100 MG PO CAPS: 100.0000 mg | ORAL_CAPSULE | Freq: Three times a day (TID) | ORAL | 5 refills | Status: AC

## 2022-04-22 NOTE — Assessment & Plan Note (Signed)
Improved now with residual healing lesions, ok for topical neosporin as needed

## 2022-04-22 NOTE — Assessment & Plan Note (Signed)
Lab Results  Component Value Date   HGBA1C 5.5 03/07/2019   Stable, pt to continue current medical treatment  - diet, wt control, excercise

## 2022-04-22 NOTE — Assessment & Plan Note (Signed)
Last vitamin D Lab Results  Component Value Date   VD25OH 18.51 (L) 04/17/2021   Low, to start oral replacement

## 2022-04-22 NOTE — Assessment & Plan Note (Signed)
No longer seeing psychiatry, symptoms overall mild chronic, declines need for change in tx today

## 2022-04-22 NOTE — Assessment & Plan Note (Signed)
Age and sex appropriate education and counseling updated with regular exercise and diet Referrals for preventative services - none needed Immunizations addressed - declines tdap, covid booster, flu shot Smoking counseling  - none needed Evidence for depression or other mood disorder - chronic anxiety stable declines need for change in tx Most recent labs reviewed. I have personally reviewed and have noted: 1) the patient's medical and social history 2) The patient's current medications and supplements 3) The patient's height, weight, and BMI have been recorded in the chart

## 2022-04-22 NOTE — Assessment & Plan Note (Signed)
Lab Results  Component Value Date   TSH 23.37 (H) 04/17/2021   Abnormal last visit, now taking med with better compliance, pt to continue levothyroxine 200 mcg and f/u lab today

## 2022-04-22 NOTE — Assessment & Plan Note (Signed)
Chronic persistent, for trial gabapentin 100 tid, refer neurology

## 2022-04-22 NOTE — Patient Instructions (Signed)
Please take all new medication as prescribed - the gabapentin  Please continue all other medications as before, and refills have been done if requested.  Please have the pharmacy call with any other refills you may need.  Please continue your efforts at being more active, low cholesterol diet, and weight control.  You are otherwise up to date with prevention measures today.  Please keep your appointments with your specialists as you may have planned  You will be contacted regarding the referral for: Neurology  Please go to the LAB at the blood drawing area for the tests to be done  You will be contacted by phone if any changes need to be made immediately.  Otherwise, you will receive a letter about your results with an explanation, but please check with MyChart first.  Please remember to sign up for MyChart if you have not done so, as this will be important to you in the future with finding out test results, communicating by private email, and scheduling acute appointments online when needed.  Please make an Appointment to return for your 1 year visit, or sooner if needed

## 2022-04-22 NOTE — Progress Notes (Signed)
Patient ID: Jermayne Sweeney, male   DOB: 1991-03-03, 31 y.o.   MRN: [id number]         Chief Complaint:: wellness exam and Physical (Needing referral for neurology-has severe neuropathy )  , folliculitis, anxiety, low thyroid, hyperglycemia, Low Vit D       HPI:  Tabias Swayze is a 31 y.o. male here for wellness exam; declines tdap, covid booster, flu shot today, o/w up to date                        Also has persistent chronic lower back pain left > right with persistent left leg pain, numbness and intermittent weakness and near falls.  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  Had repeat MRI per ortho, results not available to me, pt states told he was non surgical, and had f/u appt also with NS Dr Maurice Small for what sounds like second opinion.  Pt remains with chronic LBP now worsening left with left LE pain, numbness and several falls with weakness  Pt now asking for neurology opinion, thinks he may also have peripheral neuropathy, though this is not clear.   Denies worsening depressive symptoms, suicidal ideation, or panic; has ongoing anxiety.  Pt denies chest pain, increased sob or doe, wheezing, orthopnea, PND, increased LE swelling, palpitations, dizziness or syncope.   Pt denies polydipsia, polyuria, or new focal neuro s/s.   Denies hyper or hypo thyroid symptoms such as voice, skin or hair change.  Does have folliculitis symptoms and lesions small to the lateral chest walls right > left, improved now, no worsening redness or abscess.     Wt Readings from Last 3 Encounters:  04/22/22 267 lb (121.1 kg)  04/17/21 280 lb (127 kg)  03/07/19 287 lb (130.2 kg)   BP Readings from Last 3 Encounters:  04/22/22 118/66  04/17/21 100/60  03/07/19 110/88   Immunization History  Administered Date(s) Administered   Influenza Whole 02/03/2012, 02/02/2013   Influenza,inj,Quad PF,6+ Mos  02/15/2019   Influenza-Unspecified 02/15/2015, 02/26/2017, 02/11/2021   Moderna Sars-Covid-2 Vaccination 07/05/2019, 08/02/2019, 02/01/2020   Tdap 06/06/2007   Health Maintenance Due  Topic Date Due   DTaP/Tdap/Td (2 - Td or Tdap) 06/05/2017      Past Medical History:  Diagnosis Date   Anxiety    Thyroid disease    hypothyroidism    Past Surgical History:  Procedure Laterality Date   COSMETIC SURGERY  11/2011   for gynecomastia   TONSILLECTOMY AND ADENOIDECTOMY      reports that he has never smoked. He has never used smokeless tobacco. He reports current alcohol use of about 1.0 standard drink of alcohol per week. He reports that he does not use drugs. family history includes Arthritis in an other family member; Breast cancer in his mother; Cancer in his mother; Diabetes in his paternal grandfather; Heart attack in his father; Hyperlipidemia in an other family member; Hypertension in his father and another family member; Kidney disease in an other family member; Prostate cancer in an other family member. Allergies  Allergen Reactions   Penicillins Anaphylaxis    Swelling, hives, SOB Has patient had a PCN reaction causing immediate rash, facial/tongue/throat swelling, SOB or lightheadedness with hypotension: No Has patient had a PCN reaction causing severe rash involving mucus membranes or skin necrosis: No Has patient had a PCN  reaction that required hospitalization: No Has patient had a PCN reaction occurring within the last 10 years: No If all of the above answers are "NO", then may proceed with Cephalosporin use.   Current Outpatient Medications on File Prior to Visit  Medication Sig Dispense Refill   levothyroxine (SYNTHROID) 200 MCG tablet TAKE 1 TABLET BY MOUTH EVERY DAY 30 tablet 1   No current facility-administered medications on file prior to visit.        ROS:  All others reviewed and negative.  Objective        PE:  BP 118/66 (BP Location: Right Arm, Patient  Position: Sitting, Cuff Size: Large)   Pulse 91   Temp 98.1 F (36.7 C) (Oral)   Ht 5\' 5"  (1.651 m)   Wt 267 lb (121.1 kg)   SpO2 96%   BMI 44.43 kg/m                 Constitutional: Pt appears in NAD               HENT: Head: NCAT.                Right Ear: External ear normal.                 Left Ear: External ear normal.                Eyes: . Pupils are equal, round, and reactive to light. Conjunctivae and EOM are normal               Nose: without d/c or deformity               Neck: Neck supple. Gross normal ROM               Cardiovascular: Normal rate and regular rhythm.                 Pulmonary/Chest: Effort normal and breath sounds without rales or wheezing.                Abd:  Soft, NT, ND, + BS, no organomegaly               Neurological: Pt is alert. At baseline orientation, motor intact, cn 2.12 intact               Skin: Skin is warm. No rashes, no other new lesions, LE edema - none; LS spine nontender in midline               Psychiatric: Pt behavior is normal without agitation but mild nervous  Micro: none  Cardiac tracings I have personally interpreted today:  none  Pertinent Radiological findings (summarize): none   Lab Results  Component Value Date   WBC 7.3 04/17/2021   HGB 15.6 04/17/2021   HCT 46.4 04/17/2021   PLT 303.0 04/17/2021   GLUCOSE 118 (H) 04/17/2021   CHOL 188 04/17/2021   TRIG 122.0 04/17/2021   HDL 38.50 (L) 04/17/2021   LDLCALC 125 (H) 04/17/2021   ALT 29 04/17/2021   AST 20 04/17/2021   NA 140 04/17/2021   K 4.9 04/17/2021   CL 104 04/17/2021   CREATININE 1.00 04/17/2021   BUN 14 04/17/2021   CO2 29 04/17/2021   TSH 23.37 (H) 04/17/2021   HGBA1C 5.5 03/07/2019   Assessment/Plan:  Stacie Knutzen is a 31 y.o. White or Caucasian [1] male with  has a past medical history of Anxiety and  Thyroid disease.  Encounter for well adult exam with abnormal findings Age and sex appropriate education and counseling updated with regular  exercise and diet Referrals for preventative services - none needed Immunizations addressed - declines tdap, covid booster, flu shot Smoking counseling  - none needed Evidence for depression or other mood disorder - chronic anxiety stable declines need for change in tx Most recent labs reviewed. I have personally reviewed and have noted: 1) the patient's medical and social history 2) The patient's current medications and supplements 3) The patient's height, weight, and BMI have been recorded in the chart   Anxiety - followed by Dr. Antony Madura No longer seeing psychiatry, symptoms overall mild chronic, declines need for change in tx today  Hypothyroidism Lab Results  Component Value Date   TSH 23.37 (H) 04/17/2021   Abnormal last visit, now taking med with better compliance, pt to continue levothyroxine 200 mcg and f/u lab today  Other abnormal glucose Lab Results  Component Value Date   HGBA1C 5.5 03/07/2019   Stable, pt to continue current medical treatment  - diet, wt control, excercise   Vitamin D deficiency Last vitamin D Lab Results  Component Value Date   VD25OH 18.51 (L) 04/17/2021   Low, to start oral replacement   Lumbar radiculitis Chronic persistent, for trial gabapentin 100 tid, refer neurology  Folliculitis Improved now with residual healing lesions, ok for topical neosporin as needed  Followup: Return in about 1 year (around 04/23/2023).  Oliver Barre, MD 04/22/2022 11:33 AM  [facility]

## 2022-04-23 ENCOUNTER — Encounter: Payer: Self-pay | Admitting: Neurology

## 2022-04-23 ENCOUNTER — Encounter: Payer: Self-pay | Admitting: Internal Medicine

## 2022-04-24 NOTE — Progress Notes (Signed)
Initial neurology clinic note  SERVICE DATE: 05/07/22  Reason for Evaluation: Consultation requested by Corwin LevinsJohn, James W, MD for an opinion regarding left leg pain, numbness, weakness, near falls. My final recommendations will be communicated back to the requesting physician by way of shared medical record or letter to requesting physician via US mail.  HPI: This is Mr. Casimiro NeedleJacob Hajjar, a 31 y.o. right-handed male with a medical history of hypothyroidism, vit D deficiency, and anxiety who presents to neurology clinic with the chief complaint of leg pain, numbness, weakness, near falls. The patient is alone today.  Patient fell in booth at restaurant ~02/2021 (lawsuit pending). He smacked his left low back on a metal piece. Since the accident, he has significant leg and back pain. He has difficulty "controlling" his legs where he will go wobbly as if his legs will give out. He has had to lower himself to the ground a couple of times, but usually is able to catch him. He has shooting pains in his legs, usually 6/10, coming and going. At night, symptoms can be constant, up to 9/10. It is both legs, but left maybe worse than right. The pain can cause him to have to walk pigeon toed. He has numbness in his thighs and right ankle.  He also reports some numbness and tingling in his right hand, primarily at night. It has been present about the same time.  He has done physical therapy for his back for 3-4 months. He last went about 5-6 months ago and had to stop due to financial issues.  Patient was started on gabapentin 100 mg TID by PCP. This has helped a little. He has gained some weight, but not other significant side effects. He has tried ibuprofen in the past that did not help much.  Patient is here today per his report to document neuropathy and potentially get a handicap sticker. He mentions his PCP has previously denied this.  EtOH use: None  Restrictive diet? No Family history of  neuropathy/myopathy/NM disease? No  Patient thinks he had an MRI of his back last year. He thinks it showed issues with the left side problems. He is not sure where these images were done. [facility]. I do not have any of these records.  Patient has never had an EMG.   MEDICATIONS:  Outpatient Encounter Medications as of 05/07/2022  Medication Sig   gabapentin (NEURONTIN) 100 MG capsule Take 1 capsule (100 mg total) by mouth 3 (three) times daily.   levothyroxine (SYNTHROID) 200 MCG tablet TAKE 1 TABLET BY MOUTH EVERY DAY   [DISCONTINUED] levothyroxine (SYNTHROID) 200 MCG tablet TAKE 1 TABLET BY MOUTH EVERY DAY   No facility-administered encounter medications on file as of 05/07/2022.    PAST MEDICAL HISTORY: Past Medical History:  Diagnosis Date   Anxiety    Neuropathy    Thyroid disease    hypothyroidism     PAST SURGICAL HISTORY: Past Surgical History:  Procedure Laterality Date   COSMETIC SURGERY  11/2011   for gynecomastia   TONSILLECTOMY AND ADENOIDECTOMY      ALLERGIES: Allergies  Allergen Reactions   Penicillins Anaphylaxis    Swelling, hives, SOB Has patient had a PCN reaction causing immediate rash, facial/tongue/throat swelling, SOB or lightheadedness with hypotension: No Has patient had a PCN reaction causing severe rash involving mucus membranes or skin necrosis: No Has patient had a PCN reaction that required hospitalization: No Has patient had a PCN reaction occurring within  the last 10 years: No If all of the above answers are "NO", then may proceed with Cephalosporin use.   Aloe Other (See Comments)    FAMILY HISTORY: Family History  Problem Relation Age of Onset   Breast cancer Mother    Cancer Mother        breast cancer   Hypertension Father    Heart attack Father    Neuropathy Paternal Grandmother    Dementia Paternal Grandfather    Diabetes Paternal Grandfather    Arthritis Other        grandparent   Prostate  cancer Other        grandparent   Hyperlipidemia Other        grandparent   Hypertension Other        grandparent/parent   Kidney disease Other        grandparent    Early death Neg Hx    Stroke Neg Hx     SOCIAL HISTORY: Social History   Tobacco Use   Smoking status: Never   Smokeless tobacco: Never  Vaping Use   Vaping Use: Never used  Substance Use Topics   Alcohol use: Never    Alcohol/week: 1.0 standard drink of alcohol    Types: 1 Standard drinks or equivalent per week   Drug use: No   Social History   Social History Narrative   ATTENDED  [employer]   HOBBIES: CARD STRATAGIES GAME: LIFT WEIGHTS AND RUNNING   No caffeine   Are you right handed or left handed? Right    Are you currently employed ? [employer]   What is your current occupation?   Do you live at home alone? no   Who lives with you? Parents   What type of home do you live in: 1 story or 2 story? 2         OBJECTIVE: PHYSICAL EXAM: BP 130/83   Pulse 86   Ht 5\' 5"  (1.651 m)   Wt 269 lb 12.8 oz (122.4 kg)   SpO2 98%   BMI 44.90 kg/m   General: General appearance: Awake and alert. No distress. Cooperative with exam.  Skin: No obvious rash or jaundice. HEENT: Atraumatic. Anicteric. Lungs: Non-labored breathing on room air  Back: pain on palpation of lumbar paraspinals and muscles above left hip Extremities: No edema. No obvious deformity.  Musculoskeletal: No obvious joint swelling. Psych: Affect appropriate.  Neurological: Mental Status: Alert. Speech fluent. No pseudobulbar affect Cranial Nerves: CNII: No RAPD. Visual fields grossly intact. CNIII, IV, VI: PERRL. No nystagmus. EOMI. CN V: Facial sensation intact bilaterally to fine touch. CN VII: Facial muscles symmetric and strong. No ptosis at rest. CN VIII: Hearing grossly intact bilaterally. CN IX: No hypophonia. CN X: Palate elevates symmetrically. CN XI: Full strength shoulder shrug bilaterally. CN XII: Tongue protrusion full and  midline. No atrophy or fasciculations. No significant dysarthria Motor: Tone is normal.  Individual muscle group testing (MRC grade out of 5):  Movement   Some pain and give way weakness throughout  Neck flexion 5    Neck extension 5     Right Left   Shoulder abduction 5 5   Shoulder adduction 5 5   Shoulder ext rotation 5 5   Shoulder int rotation 5 5   Elbow flexion 5 5   Elbow extension 5 5   Wrist extension 5 5   Wrist flexion 5 5   Finger abduction - FDI 5 5   Finger abduction - ADM  5 5   Finger extension 5 5   Finger distal flexion - 2/3 5 5    Finger distal flexion - 4/5 5 5    Thumb flexion - FPL 5 5   Thumb abduction - APB 5 5    Hip flexion 5 5   Hip extension 5 5   Hip adduction 5 5   Hip abduction 5 5   Knee extension 5 5   Knee flexion 5 5   Dorsiflexion 5 5   Plantarflexion 5 5   Inversion 5 5   Eversion 5 5   Great toe extension 5 5   Great toe flexion 5 5     Reflexes:  Right Left   Bicep 2+ 2+   Tricep 2+ 2+   BrRad 2+ 2+   Knee 2+ 2+   Ankle 2+ 2+    Pathological Reflexes: Babinski: mute response bilaterally Hoffman: absent bilaterally Troemner: absent bilaterally Sensation: Pinprick: Intact except: right anterior thigh 25% of normal, 50% of normal in left medial calf, 50% of normal in top of right foot Vibration: Intact in all extremities Proprioception: Intact in bilateral great toes Coordination: Intact finger-to- nose-finger bilaterally. Romberg negative. Gait: Able to rise from chair with arms crossed unassisted. Narrow-based, antalgic gait. Able to tandem walk. Able to walk on toes and heels.  Lab and Test Review: Internal labs: 04/22/22: B12: 258 Vit D: 16.73 TSH: 0.01, free T4 normal at 1.54 HbA1c: 5.6  MRI lumbar spine wo contrast (07/01/21): FINDINGS: Segmentation: Standard.  Alignment: Trace retrolisthesis of L1 on L2 and L2 on L3.  Vertebrae: No fracture, evidence of discitis, or bone lesion.  Conus medullaris and  cauda equina: Conus extends to the L1-2 level. Conus and cauda equina appear normal.  Paraspinal and other soft tissues: Negative.  Disc levels:  T12-L1: Mild broad-based disc bulge, slightly eccentric to the left. Unremarkable facet joints. No foraminal or canal stenosis. Unchanged.  L1-L2: Mild broad-based disc bulge and degenerative endplate spurring. Unremarkable facet joints. Borderline-mild canal stenosis. No foraminal stenosis. Unchanged.  L2-L3: Mild broad-based disc bulge, slightly eccentric to the left. Unremarkable facet joints. Mild-to-moderate canal stenosis and mild left foraminal stenosis, minimally progressed.  L3-L4: Minimal broad-based disc bulge. Unremarkable facet joints. No foraminal or canal stenosis. Unchanged.  L4-L5: Unremarkable.  L5-S1: Unremarkable.  IMPRESSION: 1. Mild degenerative changes of the lumbar spine, as detailed above. 2. Mild-to-moderate canal stenosis and mild left foraminal stenosis at L2-L3, minimally progressed. 3. Unchanged borderline-mild canal stenosis at L1-L2.   MRI thoracic and lumbar spine wo contrast (03/07/2018): FINDINGS: MRI THORACIC SPINE FINDINGS   Alignment:  AP alignment is anatomic.   Vertebrae: Marrow signal and vertebral body heights are normal. Schmorl's nodes are evident at T9-10, T10-11, T11-12, and T12-L1.   Cord:  Normal signal is present throughout the thoracic spinal cord.   Paraspinal and other soft tissues: Paraspinous soft tissues are unremarkable. Limited imaging the lungs is within normal limits.   Disc levels:   Asymmetric right-sided facet hypertrophy is present at T4-5 with some encroachment of posterior canal but no significant stenosis. Foramina are patent in the upper thoracic spine.   T10-11: Lateral disc bulging and mild facet hypertrophy contribute to mild foraminal narrowing bilaterally. The central canal is patent.   T11-12: Negative.   T12-L1: Mild leftward disc bulging is  present without significant stenosis.   MRI LUMBAR SPINE FINDINGS   Segmentation: 5 non rib-bearing lumbar type vertebral bodies are present. The lowest fully formed vertebral body  is L5.   Alignment:  AP alignment is anatomic.   Vertebrae: Edematous posterior endplate marrow changes are present at L2-3. More chronic posterior endplate changes are present at L1-2. Schmorl's nodes are present at L2-3 and L3-4. Marrow signal and vertebral body heights are otherwise normal.   Conus medullaris and cauda equina: Conus extends to the L1-2 level. Conus and cauda equina appear normal.   Paraspinal and other soft tissues: Limited imaging of the abdomen is unremarkable. No solid organ lesions are present. There is no significant adenopathy.   Disc levels:   L1-2: A mild broad-based disc protrusion is present. No significant compressive stenosis is present.   L2-3: A left paramedian disc protrusion is present. This results in moderate left central canal stenosis. The foramina are patent. This may be an acute disc rupture.   L3-4: Mild disc bulging is present without significant stenosis.   L4-5: Negative.   L5-S1: Negative.   IMPRESSION: 1. Left paramedian disc protrusion with edematous endplate changes. This may be an acute or subacute disc rupture. There is moderate left central canal stenosis. The foramina are patent. 2. More chronic broad-based disc protrusion at L1-2 without significant stenosis. 3. Mild disc bulging at L3-4 without significant stenosis. 4. Mild foraminal narrowing bilaterally at T10-11 secondary to lateral disc bulging and mild bilateral facet hypertrophy. 5. Asymmetric right-sided facet hypertrophy at T4-5 without significant stenosis. 6. No other focal stenosis in the thoracic spine.  CT head wo contrast (01/20/13): FINDINGS:  Ventricles overall are normal in size and configuration. The right  lateral ventricle is somewhat larger than the left lateral   ventricle, a presumed anatomic variant.   There is no mass, hemorrhage, extra-axial fluid collection, or  midline shift. Gray-white compartments are normal. There is no  demonstrable acute infarct.   Bony calvarium appears intact. The mastoid air cells are clear.   IMPRESSION:  Study within normal limits.   ASSESSMENT: Keevan Wolz is a 31 y.o. male who presents for evaluation of pain and weakness in bilateral legs with back pain and right hand/arm pain. He has a relevant medical history of anxiety, hypothyroidism, and vit D deficiency. His neurological examination is essentially normal today. The etiology of patient's symptoms is currently unclear. Patient was asking about potential neuropathy, but I do not see clear evidence of this today. I will get blood work and an EMG to further evaluate.  PLAN: -EMG: bilateral legs as these are most symptomatic per patient -Blood work: HbA1c, IFE -Vit D 1000 IU daily -B12 1000 mcg daily -Continue gabapentin 100 mg TID. May need to consider increasing if symptoms are found to be nerve related. -Will attempt to get records from [facility] -No clear need for handicap sticker today as patient ambulates well without assistance  -Return to clinic to be determined after testing is complete  The impression above as well as the plan as outlined below were extensively discussed with the patient who voiced understanding. All questions were answered to their satisfaction.  The patient was counseled on pertinent fall precautions per the printed material provided today, and as noted under the "Patient Instructions" section below.  When available, results of the above investigations and possible further recommendations will be communicated to the patient via telephone/MyChart. Patient to call office if not contacted after expected testing turnaround time.   Total time spent reviewing records, interview, history/exam, documentation, and  coordination of care on day of encounter:  45 min   Thank you for allowing me to  participate in patient's care.  If I can answer any additional questions, I would be pleased to do so.  Jacquelyne Balint, MD   CC: Corwin Levins, MD [address] [place] [address]  CC: Referring provider: Corwin Levins, MD [id number] [place],  [place] [address]

## 2022-04-30 ENCOUNTER — Other Ambulatory Visit: Payer: Self-pay | Admitting: Internal Medicine

## 2022-04-30 NOTE — Telephone Encounter (Signed)
Please refill as per office routine med refill policy (all routine meds to be refilled for 3 mo or monthly (per pt preference) up to one year from last visit, then month to month grace period for 3 mo, then further med refills will have to be denied) ? ?

## 2022-05-07 ENCOUNTER — Encounter: Payer: Self-pay | Admitting: Neurology

## 2022-05-07 ENCOUNTER — Other Ambulatory Visit (INDEPENDENT_AMBULATORY_CARE_PROVIDER_SITE_OTHER): Payer: 59

## 2022-05-07 ENCOUNTER — Ambulatory Visit: Payer: 59 | Admitting: Neurology

## 2022-05-07 VITALS — BP 130/83 | HR 86 | Ht 65.0 in | Wt 269.8 lb

## 2022-05-07 DIAGNOSIS — Z131 Encounter for screening for diabetes mellitus: Secondary | ICD-10-CM | POA: Diagnosis not present

## 2022-05-07 DIAGNOSIS — M545 Low back pain, unspecified: Secondary | ICD-10-CM

## 2022-05-07 DIAGNOSIS — M79604 Pain in right leg: Secondary | ICD-10-CM

## 2022-05-07 DIAGNOSIS — R209 Unspecified disturbances of skin sensation: Secondary | ICD-10-CM | POA: Diagnosis not present

## 2022-05-07 DIAGNOSIS — M79605 Pain in left leg: Secondary | ICD-10-CM

## 2022-05-07 LAB — HEMOGLOBIN A1C: Hgb A1c MFr Bld: 5.7 % (ref 4.6–6.5)

## 2022-05-07 NOTE — Patient Instructions (Addendum)
I would like to get blood work today.  I would like to get muscle and nerve testing called an EMG (see below for more information).  I will be in touch when I have your results.  Recommend vit D supplement 1000 international units daily.  Your B12 was borderline low. Given your symptoms, I would recommend supplementing with B12 1000 mcg daily. This can be bought over the counter at any local drug store or online.   I will get records from [facility] as well.  The physicians and staff at [facility] are committed to providing excellent care. You may receive a survey requesting feedback about your experience at our office. We strive to receive "very good" responses to the survey questions. If you feel that your experience would prevent you from giving the office a "very good " response, please contact our office to try to remedy the situation. We may be reached at [phone]. Thank you for taking the time out of your busy day to complete the survey.  Kai Levins, MD [facility]  ELECTROMYOGRAM AND NERVE CONDUCTION STUDIES (EMG/NCS) INSTRUCTIONS  How to Prepare The neurologist conducting the EMG will need to know if you have certain medical conditions. Tell the neurologist and other EMG lab personnel if you: Have a pacemaker or any other electrical medical device Take blood-thinning medications Have hemophilia, a blood-clotting disorder that causes prolonged bleeding Bathing Take a shower or bath shortly before your exam in order to remove oils from your skin. Don't apply lotions or creams before the exam.  What to Expect You'll likely be asked to change into a hospital gown for the procedure and lie down on an examination table. The following explanations can help you understand what will happen during the exam.  Electrodes. The neurologist or a technician places surface electrodes at various locations on your skin depending on where you're experiencing symptoms. Or  the neurologist may insert needle electrodes at different sites depending on your symptoms.  Sensations. The electrodes will at times transmit a tiny electrical current that you may feel as a twinge or spasm. The needle electrode may cause discomfort or pain that usually ends shortly after the needle is removed. If you are concerned about discomfort or pain, you may want to talk to the neurologist about taking a short break during the exam.  Instructions. During the needle EMG, the neurologist will assess whether there is any spontaneous electrical activity when the muscle is at rest - activity that isn't present in healthy muscle tissue - and the degree of activity when you slightly contract the muscle.  He or she will give you instructions on resting and contracting a muscle at appropriate times. Depending on what muscles and nerves the neurologist is examining, he or she may ask you to change positions during the exam.  After your EMG You may experience some temporary, minor bruising where the needle electrode was inserted into your muscle. This bruising should fade within several days. If it persists, contact your primary care doctor.    Preventing Falls at [facility] are common, often dreaded events in the lives of older people. Aside from the obvious injuries and even death that may result, fall can cause wide-ranging consequences including loss of independence, mental decline, decreased activity and mobility. Younger people are also at risk of falling, especially those with chronic illnesses and fatigue.  Ways to reduce risk for falling Examine diet and medications. Warm foods and alcohol dilate blood vessels, which  can lead to dizziness when standing. Sleep aids, antidepressants and pain medications can also increase the likelihood of a fall.  Get a vision exam. Poor vision, cataracts and glaucoma increase the chances of falling.  Check foot gear. Shoes should fit snugly and have a sturdy,  nonskid sole and a broad, low heel  Participate in a physician-approved exercise program to build and maintain muscle strength and improve balance and coordination. Programs that use ankle weights or stretch bands are excellent for muscle-strengthening. Water aerobics programs and low-impact Tai Chi programs have also been shown to improve balance and coordination.  Increase vitamin D intake. Vitamin D improves muscle strength and increases the amount of calcium the body is able to absorb and deposit in bones.  How to prevent falls from common hazards Floors - Remove all loose wires, cords, and throw rugs. Minimize clutter. Make sure rugs are anchored and smooth. Keep furniture in its usual place.  Chairs -- Use chairs with straight backs, armrests and firm seats. Add firm cushions to existing pieces to add height.  Bathroom - Install grab bars and non-skid tape in the tub or shower. Use a bathtub transfer bench or a shower chair with a back support Use an elevated toilet seat and/or safety rails to assist standing from a low surface. Do not use towel racks or bathroom tissue holders to help you stand.  Lighting - Make sure halls, stairways, and entrances are well-lit. Install a night light in your bathroom or hallway. Make sure there is a light switch at the top and bottom of the staircase. Turn lights on if you get up in the middle of the night. Make sure lamps or light switches are within reach of the bed if you have to get up during the night.  Kitchen - Install non-skid rubber mats near the sink and stove. Clean spills immediately. Store frequently used utensils, pots, pans between waist and eye level. This helps prevent reaching and bending. Sit when getting things out of lower cupboards.  Living room/ Bedrooms - Place furniture with wide spaces in between, giving enough room to move around. Establish a route through the living room that gives you something to hold onto as you walk.  Stairs  - Make sure treads, rails, and rugs are secure. Install a rail on both sides of the stairs. If stairs are a threat, it might be helpful to arrange most of your activities on the lower level to reduce the number of times you must climb the stairs.  Entrances and doorways - Install metal handles on the walls adjacent to the doorknobs of all doors to make it more secure as you travel through the doorway.  Tips for maintaining balance Keep at least one hand free at all times. Try using a backpack or fanny pack to hold things rather than carrying them in your hands. Never carry objects in both hands when walking as this interferes with keeping your balance.  Attempt to swing both arms from front to back while walking. This might require a conscious effort if Parkinson's disease has diminished your movement. It will, however, help you to maintain balance and posture, and reduce fatigue.  Consciously lift your feet off of the ground when walking. Shuffling and dragging of the feet is a common culprit in losing your balance.  When trying to navigate turns, use a "U" technique of facing forward and making a wide turn, rather than pivoting sharply.  Try to stand with your feet shoulder-length  apart. When your feet are close together for any length of time, you increase your risk of losing your balance and falling.  Do one thing at a time. Don't try to walk and accomplish another task, such as reading or looking around. The decrease in your automatic reflexes complicates motor function, so the less distraction, the better.  Do not wear rubber or gripping soled shoes, they might "catch" on the floor and cause tripping.  Move slowly when changing positions. Use deliberate, concentrated movements and, if needed, use a grab bar or walking aid. Count 15 seconds between each movement. For example, when rising from a seated position, wait 15 seconds after standing to begin walking.  If balance is a continuous  problem, you might want to consider a walking aid such as a cane, walking stick, or walker. Once you've mastered walking with help, you might be ready to try it on your own again.

## 2022-05-13 LAB — IMMUNOFIXATION, SERUM
IgA/Immunoglobulin A, Serum: 211 mg/dL (ref 90–386)
IgG (Immunoglobin G), Serum: 1064 mg/dL (ref 603–1613)
IgM (Immunoglobulin M), Srm: 86 mg/dL (ref 20–172)

## 2022-05-13 LAB — SPECIMEN STATUS REPORT

## 2022-05-19 ENCOUNTER — Telehealth: Payer: Self-pay | Admitting: Neurology

## 2022-05-19 ENCOUNTER — Ambulatory Visit: Payer: 59 | Admitting: Neurology

## 2022-05-19 DIAGNOSIS — R209 Unspecified disturbances of skin sensation: Secondary | ICD-10-CM

## 2022-05-19 DIAGNOSIS — M79605 Pain in left leg: Secondary | ICD-10-CM | POA: Diagnosis not present

## 2022-05-19 DIAGNOSIS — M79604 Pain in right leg: Secondary | ICD-10-CM | POA: Diagnosis not present

## 2022-05-19 DIAGNOSIS — M545 Low back pain, unspecified: Secondary | ICD-10-CM

## 2022-05-19 NOTE — Procedures (Signed)
[facility]  [place], [place]  [place],  [address] Tel: [phone] Fax: [phone] Test Date:  05/19/2022  Patient: Brian Fowler DOB: Nov 29, 1990 Physician: Kai Levins, MD  Sex: Male Height: 5\' 5"  Ref Phys: Kai Levins, MD  ID#: [id number]   Technician:    History: This is a 32 year old male with back pain, leg pain and weakness, and right arm pain.  NCV & EMG Findings: Extensive electrodiagnostic evaluation of bilateral lower limbs with additional nerve conduction studies of the right upper limb shows: Bilateral sural, bilateral superficial peroneal/fibular, right median, right ulnar, and right radial sensory responses are within normal limits. Bilateral peroneal/fibular (EDB), bilateral tibial (AH), right median (APB), and right ulnar (ADM) motor responses are within normal limits. Bilateral H reflex latencies are within normal limits. There is no evidence of active or chronic motor axon loss changes affecting any of the tested muscles on needle examination. Motor unit configuration and recruitment pattern is within normal limits.  Impression: This is a normal electrodiagnostic evaluation. Specifically: No electrodiagnostic evidence of a right or left lumbosacral (L3-S1) motor radiculopathy.  No electrodiagnostic evidence of a large fiber sensorimotor polyneuropathy. No electrodiagnostic evidence of a right median mononeuropathy at or distal to the wrist, consistent with carpal tunnel syndrome. Screening studies for right ulnar, and right radial mononeuropathies are normal.   ___________________________ Kai Levins, MD    Nerve Conduction Studies Motor Nerve Results    Latency Amplitude F-Lat Segment Distance CV Comment  Site (ms) Norm (mV) Norm (ms)  (cm) (m/s) Norm   Left Fibular (EDB) Motor  Ankle 3.1  < 5.5 4.1  > 3.0        Bel fib head 9.1 - 3.6 -  Bel fib head-Ankle 31 52  > 40   Pop fossa 10.7 - 3.5 -  Pop fossa-Bel fib head 8 50  -   Right Fibular (EDB) Motor  Ankle 3.2  < 5.5 4.1  > 3.0        Bel fib head 8.7 - 3.8 -  Bel fib head-Ankle 32 58  > 40   Pop fossa 10.2 - 3.7 -  Pop fossa-Bel fib head 8 53 -   Right Median (APB) Motor  Wrist 2.3  < 3.9 14.5  > 6.0        Elbow 6.1 - 14.3 -  Elbow-Wrist 24.5 64  > 50   Left Tibial (AH) Motor  Ankle 3.8  < 6.0 15.2  > 8.0        Knee 10.8 - 12.5 -  Knee-Ankle 36 51  > 40   Right Tibial (AH) Motor  Ankle 3.1  < 6.0 14.9  > 8.0        Knee 9.9 - 12.9 -  Knee-Ankle 38.5 57  > 40   Right Ulnar (ADM) Motor  Wrist 2.1  < 3.1 12.4  > 7.0        Bel elbow 5.2 - 12.3 -  Bel elbow-Wrist 20.5 66  > 50   Ab elbow 7.1 - 12.3 -  Ab elbow-Bel elbow 10 53 -    Sensory Sites    Neg Peak Lat Amplitude (O-P) Segment Distance Velocity Comment  Site (ms) Norm (V) Norm  (cm) (ms)   Right Median Sensory  Wrist-Dig II 3.3  < 3.4 86  > 20 Wrist-Dig II 13    Right Radial Sensory  Forearm-Wrist 2.1  < 2.7 31  > 18 Forearm-Wrist 10  Left Superficial Fibular Sensory  14 cm-Ankle 2.7  < 4.5 13  > 5 14 cm-Ankle 14    Right Superficial Fibular Sensory  14 cm-Ankle 2.3  < 4.5 25  > 5 14 cm-Ankle 14    Left Sural Sensory  Calf-Lat mall 3.4  < 4.5 17  > 5 Calf-Lat mall 14    Right Sural Sensory  Calf-Lat mall 4.2  < 4.5 12  > 5 Calf-Lat mall 14    Right Ulnar Sensory  Wrist-Dig V 2.8  < 3.1 51  > 12 Wrist-Dig V 11     H-Reflex Results    M-Lat H Lat H Neg Amp H-M Lat  Site (ms) (ms) Norm (mV) (ms)  Left Tibial H-Reflex  Pop fossa 6.5 29.9  < 35.0 4.0 23.4  Right Tibial H-Reflex  Pop fossa 5.8 29.9  < 35.0 2.7 24.1   Electromyography   Side Muscle Ins.Act Fibs Fasc Recrt Amp Dur Poly Activation Comment  Right Tib ant Nml Nml Nml Nml Nml Nml Nml Nml N/A  Right Gastroc MH Nml Nml Nml Nml Nml Nml Nml Nml N/A  Right FDL Nml Nml Nml Nml Nml Nml Nml Nml N/A  Right Vastus lat Nml Nml Nml Nml Nml Nml Nml Nml N/A  Right Gluteus med Nml Nml Nml Nml Nml Nml Nml Nml N/A  Left Tib ant  Nml Nml Nml Nml Nml Nml Nml Nml N/A  Left Gastroc MH Nml Nml Nml Nml Nml Nml Nml Nml N/A  Left FDL Nml Nml Nml Nml Nml Nml Nml Nml N/A  Left Vastus lat Nml Nml Nml Nml Nml Nml Nml Nml N/A  Left Gluteus med Nml Nml Nml Nml Nml Nml Nml Nml N/A      Waveforms:  Motor               Sensory                  H-Reflex

## 2022-05-19 NOTE — Telephone Encounter (Signed)
Discussed the results of patient's EMG after the procedure today. EMG of bilateral lower limbs and nerve conduction studies of the right upper limb was normal. I explained that I did not have a good neurologic explanation for his symptoms.  All questions were answered.  Brian Levins, MD [facility]

## 2022-11-04 ENCOUNTER — Other Ambulatory Visit: Payer: Self-pay | Admitting: Internal Medicine

## 2022-11-04 ENCOUNTER — Other Ambulatory Visit: Payer: Self-pay

## 2022-11-18 ENCOUNTER — Telehealth: Payer: Self-pay | Admitting: Internal Medicine

## 2022-11-18 MED ORDER — LEVOTHYROXINE SODIUM 200 MCG PO TABS: 200.0000 ug | ORAL_TABLET | Freq: Every day | ORAL | 1 refills | Status: DC

## 2022-11-18 NOTE — Telephone Encounter (Signed)
Prescription Request  11/18/2022  LOV: 04/22/2022  What is the name of the medication or equipment? levothyroxine (SYNTHROID) 200 MCG tablet   Have you contacted your pharmacy to request a refill? No   Which pharmacy would you like this sent to?  [facility] [address] [place], [place], [place] [address] [phone] Patient notified that their request is being sent to the clinical staff for review and that they should receive a response within 2 business days.   Please advise at Mobile [phone] (mobile)

## 2022-11-18 NOTE — Telephone Encounter (Signed)
Sent refill to [facility].Marland KitchenRaechel Chute

## 2023-01-13 ENCOUNTER — Ambulatory Visit: Payer: 59 | Admitting: Emergency Medicine

## 2023-05-14 ENCOUNTER — Other Ambulatory Visit: Payer: Self-pay | Admitting: Internal Medicine

## 2023-05-14 ENCOUNTER — Other Ambulatory Visit: Payer: Self-pay

## 2023-06-26 ENCOUNTER — Telehealth: Payer: Self-pay

## 2023-06-26 NOTE — Telephone Encounter (Signed)
Copied from CRM [id number]. Topic: Clinical - Medication Refill >> Jun 26, 2023  4:29 PM Alcus Dad wrote: Most Recent Primary Care Visit:  Provider: Corwin Levins  Department: [facility]  Visit Type: OFFICE VISIT  Date: 04/22/2022 Mother of patient stated that her son isn't getting any better and Dr. Jonny Ruiz told her to let him know if patient isn't getting better Medication: Prednisone  Has the patient contacted their pharmacy? No (Agent: If no, request that the patient contact the pharmacy for the refill. If patient does not wish to contact the pharmacy document the reason why and proceed with request.) (Agent: If yes, when and what did the pharmacy advise?)  Is this the correct pharmacy for this prescription? Yes If no, delete pharmacy and type the correct one.  This is the patient's preferred pharmacy:  [id number] - [place], [place] - [address] AT [address] [facility] [address] [place] [address] Phone: [phone] Fax: [phone]  Has the prescription been filled recently? No  Is the patient out of the medication? Yes  Has the patient been seen for an appointment in the last year OR does the patient have an upcoming appointment? Yes  Can we respond through MyChart? Yes  Agent: Please be advised that Rx refills may take up to 3 business days. We ask that you follow-up with your pharmacy.

## 2023-06-29 NOTE — Telephone Encounter (Signed)
 Needs ROV - last visit was dec 2023

## 2023-06-29 NOTE — Telephone Encounter (Signed)
 See below

## 2023-07-01 NOTE — Telephone Encounter (Signed)
 This was charted in the wrong Patient chart.

## 2024-02-23 ENCOUNTER — Telehealth: Payer: Self-pay

## 2024-02-23 NOTE — Telephone Encounter (Signed)
 Call returned.
